# Patient Record
Sex: Female | Born: 1975 | Race: White | Hispanic: No | Marital: Married | Smoking: Never smoker
Health system: Southern US, Community
[De-identification: ages and names within clinical notes are randomized; demographics above are authoritative.]

## PROBLEM LIST (undated history)

## (undated) DIAGNOSIS — R7303 Prediabetes: Secondary | ICD-10-CM

## (undated) DIAGNOSIS — E669 Obesity, unspecified: Secondary | ICD-10-CM

## (undated) DIAGNOSIS — G4730 Sleep apnea, unspecified: Secondary | ICD-10-CM

## (undated) DIAGNOSIS — L723 Sebaceous cyst: Secondary | ICD-10-CM

## (undated) DIAGNOSIS — F341 Dysthymic disorder: Secondary | ICD-10-CM

## (undated) DIAGNOSIS — L0390 Cellulitis, unspecified: Secondary | ICD-10-CM

## (undated) DIAGNOSIS — E221 Hyperprolactinemia: Secondary | ICD-10-CM

## (undated) DIAGNOSIS — E785 Hyperlipidemia, unspecified: Secondary | ICD-10-CM

## (undated) DIAGNOSIS — E119 Type 2 diabetes mellitus without complications: Secondary | ICD-10-CM

## (undated) DIAGNOSIS — R0902 Hypoxemia: Secondary | ICD-10-CM

## (undated) DIAGNOSIS — F419 Anxiety disorder, unspecified: Secondary | ICD-10-CM

## (undated) DIAGNOSIS — R5383 Other fatigue: Secondary | ICD-10-CM

## (undated) HISTORY — DX: Obesity, unspecified: E66.9

## (undated) HISTORY — DX: Hypoxemia: R09.02

## (undated) HISTORY — DX: Type 2 diabetes mellitus without complications: E11.9

## (undated) HISTORY — DX: Sebaceous cyst: L72.3

## (undated) HISTORY — DX: Anxiety disorder, unspecified: F41.9

## (undated) HISTORY — DX: Hyperprolactinemia: E22.1

## (undated) HISTORY — DX: Hyperlipidemia, unspecified: E78.5

## (undated) HISTORY — DX: Prediabetes: R73.03

## (undated) HISTORY — DX: Cellulitis, unspecified: L03.90

## (undated) HISTORY — DX: Other fatigue: R53.83

## (undated) HISTORY — DX: Dysthymic disorder: F34.1

## (undated) HISTORY — DX: Sleep apnea, unspecified: G47.30

## (undated) HISTORY — PX: ABDOMINAL HYSTERECTOMY: SHX81

---

## 2015-10-14 ENCOUNTER — Ambulatory Visit (INDEPENDENT_AMBULATORY_CARE_PROVIDER_SITE_OTHER): Payer: Managed Care, Other (non HMO) | Admitting: Sports Medicine

## 2015-10-14 ENCOUNTER — Encounter: Payer: Self-pay | Admitting: Sports Medicine

## 2015-10-14 DIAGNOSIS — B351 Tinea unguium: Secondary | ICD-10-CM

## 2015-10-14 DIAGNOSIS — M79671 Pain in right foot: Secondary | ICD-10-CM

## 2015-10-14 DIAGNOSIS — M79672 Pain in left foot: Secondary | ICD-10-CM

## 2015-10-14 DIAGNOSIS — L853 Xerosis cutis: Secondary | ICD-10-CM | POA: Diagnosis not present

## 2015-10-14 DIAGNOSIS — M21619 Bunion of unspecified foot: Secondary | ICD-10-CM | POA: Diagnosis not present

## 2015-10-14 MED ORDER — TERBINAFINE HCL 250 MG PO TABS: 250.0000 mg | ORAL_TABLET | Freq: Every day | ORAL | Status: DC

## 2015-10-14 NOTE — Progress Notes (Deleted)
° °  Subjective:    Patient ID: Chelsea Freeman, female    DOB: 1976/04/19, 39 y.o.   MRN: [id number]  HPI    Review of Systems  Cardiovascular:       High or low BP   Psychiatric/Behavioral: The patient is nervous/anxious.   All other systems reviewed and are negative.      Objective:   Physical Exam        Assessment & Plan:

## 2015-10-14 NOTE — Patient Instructions (Signed)
Preventing Toenail Fungus from Recurring   Sanitize your shoes with Mycomist spray or a similar shoe sanitizer spray.  Follow the instructions on the bottle and dry them outside in the sun or with a hairdryer.  We also recommend repeating the sanitization once weekly in shoes you wear most often.   Throw away any shoes you have worn a significant amount without socks-fungus thrives in a warm moist environment and you want to avoid re-infection after your laser procedure   Bleach your socks with regular or color safe bleach   Change your socks regularly to keep your feet clean and dry (especially if you have sweaty feet)-if sweaty feet are a problem, let your doctor know-there is a great lotion that helps with this problem.   Clean your toenail clippers with alcohol before you use them if you do your own toenails and make sure to replace [employer] boards and orange sticks regularly   If you get regular pedicures, bring your own instruments or go to a spa that sterilizes their instruments in an autoclave.

## 2015-10-14 NOTE — Progress Notes (Signed)
Patient ID: Chelsea Freeman, female   DOB: Nov 29, 1975, 39 y.o.   MRN: [id number] Subjective: Chelsea Freeman is a 39 y.o. female patient seen today in office with complaint of painful thickened big toe nails concerning for fungus reports that she has tried multiple over the counter products with no improvement in her nails; desires treatment. Patient denies history of Diabetes, Neuropathy, or Vascular disease. Patient has no other pedal complaints at this time.   Review of Systems  Cardiovascular:       High or low BP   Psychiatric/Behavioral: The patient is nervous/anxious.   All other systems reviewed and are negative.  There are no active problems to display for this patient.  No current outpatient prescriptions on file prior to visit.   No current facility-administered medications on file prior to visit.   No Known Allergies   Objective: Physical Exam  General: Well developed, nourished, no acute distress, awake, alert and oriented x 3  Vascular: Dorsalis pedis artery 2/4 bilateral, Posterior tibial artery 2/4 bilateral, skin temperature warm to warm proximal to distal bilateral lower extremities, no varicosities, pedal hair present bilateral.  Neurological: Gross sensation present via light touch bilateral.   Dermatological: Skin is warm, dry, and supple bilateral, Bilateral hallux nails are, long, thick, and discolored with moderate subungal debris with mild lifting of left>right hallux nail, all other nails are short and mildly dystrophic, no webspace macerations present bilateral, no open lesions present bilateral, dry skin present bilateral heels with no signs of infection bilateral.  Musculoskeletal: Asymptomatic bunion deformities noted bilateral. Muscular strength within normal limits without pain or limitation on range of motion. No pain with calf compression bilateral.  Assessment and Plan:  Problem List Items Addressed This Visit    None    Visit Diagnoses     Dermatophytosis of nail    -  Primary    Dry skin        Foot pain, bilateral        Bunion          -Examined patient.  -Discussed treatment options for painful fungal nails. -Patient declined debridement and opt for oral antifungal treatment. -Recent lab work was obtained and reviewed from [facility]; labs are within normal limits -Rx Lamisil with instruction to take 1 pill a day and to monitor for adverse reactions; if experienced to call office and d/c medication. -Recommend good hygiene habits and tips given to prevent recurrence of nail fungus  -Recommended skin emollients for bilateral heels to use daily and good supportive shoes for foot type daily. -Patient to return in 6 weeks for follow up evaluation/medication management(LFTs) or sooner if symptoms worsen.  Landis Martins, DPM

## 2015-11-25 ENCOUNTER — Other Ambulatory Visit: Payer: Self-pay | Admitting: Sports Medicine

## 2015-11-25 ENCOUNTER — Encounter: Payer: Self-pay | Admitting: Sports Medicine

## 2015-11-25 ENCOUNTER — Ambulatory Visit (INDEPENDENT_AMBULATORY_CARE_PROVIDER_SITE_OTHER): Payer: Managed Care, Other (non HMO) | Admitting: Sports Medicine

## 2015-11-25 DIAGNOSIS — L853 Xerosis cutis: Secondary | ICD-10-CM | POA: Diagnosis not present

## 2015-11-25 DIAGNOSIS — B351 Tinea unguium: Secondary | ICD-10-CM | POA: Diagnosis not present

## 2015-11-25 DIAGNOSIS — M21619 Bunion of unspecified foot: Secondary | ICD-10-CM

## 2015-11-25 DIAGNOSIS — M79671 Pain in right foot: Secondary | ICD-10-CM

## 2015-11-25 DIAGNOSIS — M79672 Pain in left foot: Secondary | ICD-10-CM | POA: Diagnosis not present

## 2015-11-25 DIAGNOSIS — Z79899 Other long term (current) drug therapy: Secondary | ICD-10-CM | POA: Diagnosis not present

## 2015-11-25 LAB — CBC WITH DIFFERENTIAL/PLATELET
BASOS ABS: 0 10*3/uL (ref 0.0–0.2)
Basos: 0 %
EOS (ABSOLUTE): 0.1 10*3/uL (ref 0.0–0.4)
Eos: 2 %
Hematocrit: 42.8 % (ref 34.0–46.6)
Hemoglobin: 14.5 g/dL (ref 11.1–15.9)
LYMPHS ABS: 1.4 10*3/uL (ref 0.7–3.1)
Lymphs: 20 %
MCH: 29.8 pg (ref 26.6–33.0)
MCHC: 33.9 g/dL (ref 31.5–35.7)
MCV: 88 fL (ref 79–97)
MONOCYTES: 9 %
MONOS ABS: 0.6 10*3/uL (ref 0.1–0.9)
NEUTROS ABS: 4.5 10*3/uL (ref 1.4–7.0)
Neutrophils: 69 %
PLATELETS: 263 10*3/uL (ref 150–379)
RBC: 4.86 x10E6/uL (ref 3.77–5.28)
RDW: 13.5 % (ref 12.3–15.4)
WBC: 6.6 10*3/uL (ref 3.4–10.8)

## 2015-11-25 LAB — COMPREHENSIVE METABOLIC PANEL
A/G RATIO: 1.9 (ref 1.1–2.5)
ALT: 21 IU/L (ref 0–32)
AST: 12 IU/L (ref 0–40)
Albumin: 4.5 g/dL (ref 3.5–5.5)
Alkaline Phosphatase: 60 IU/L (ref 39–117)
BILIRUBIN TOTAL: 0.3 mg/dL (ref 0.0–1.2)
BUN/Creatinine Ratio: 21 — ABNORMAL HIGH (ref 8–20)
BUN: 13 mg/dL (ref 6–20)
CALCIUM: 9.7 mg/dL (ref 8.7–10.2)
CHLORIDE: 103 mmol/L (ref 96–106)
CO2: 27 mmol/L (ref 18–29)
Creatinine, Ser: 0.62 mg/dL (ref 0.57–1.00)
GFR calc Af Amer: 131 mL/min/{1.73_m2} (ref >59)
GFR calc non Af Amer: 114 mL/min/{1.73_m2} (ref >59)
GLUCOSE: 102 mg/dL — AB (ref 65–99)
Globulin, Total: 2.4 g/dL (ref 1.5–4.5)
POTASSIUM: 4.4 mmol/L (ref 3.5–5.2)
Sodium: 140 mmol/L (ref 134–144)
TOTAL PROTEIN: 6.9 g/dL (ref 6.0–8.5)

## 2015-11-25 LAB — HEPATIC FUNCTION PANEL: Bilirubin, Direct: <0.1 mg/dL (ref 0.00–0.40)

## 2015-11-25 NOTE — Progress Notes (Signed)
Patient ID: Chelsea Freeman, female   DOB: July 19, 1976, 40 y.o.   MRN: [id number]  Subjective: Chelsea Freeman is a 40 y.o. female patient seen today in office for follow up eval of  painful thickened fungal big toe nails. Patient is on Lamisil with no problems. Reports that Okeffe cream is helping dry heels. Patient has no other pedal complaints at this time.   There are no active problems to display for this patient.  Current Outpatient Prescriptions on File Prior to Visit  Medication Sig Dispense Refill   ALPRAZolam (XANAX) 0.25 MG tablet Take 0.25 mg by mouth daily as needed.  0   citalopram (CELEXA) 20 MG tablet Take 30 mg by mouth daily.  5   citalopram (CELEXA) 40 MG tablet Take 40 mg by mouth daily.  1   hydrochlorothiazide (MICROZIDE) 12.5 MG capsule Take 12.5 mg by mouth daily.  1   JUNEL 1.5/30 1.5-30 MG-MCG tablet TAKE 1 TABLET BY MOUTH ONCE (1) DAILY  3   terbinafine (LAMISIL) 250 MG tablet Take 1 tablet (250 mg total) by mouth daily. 30 tablet 3   No current facility-administered medications on file prior to visit.   No Known Allergies   Objective: Physical Exam  General: Well developed, nourished, no acute distress, awake, alert and oriented x 3  Vascular: Dorsalis pedis artery 2/4 bilateral, Posterior tibial artery 2/4 bilateral, skin temperature warm to warm proximal to distal bilateral lower extremities, no varicosities, pedal hair present bilateral.  Neurological: Gross sensation present via light touch bilateral.   Dermatological: Skin is warm, dry, and supple bilateral, Bilateral hallux nails are, mildly elongated, thick, and discolored with moderate subungal debris with mild lifting of left>right hallux nail, all other nails are short and mildly dystrophic, no webspace macerations present bilateral, no open lesions present bilateral, dry skin present bilateral heels with no signs of infection bilateral.  Musculoskeletal: Asymptomatic bunion deformities noted  bilateral. Muscular strength within normal limits without pain or limitation on range of motion. No pain with calf compression bilateral.  Assessment and Plan:  Problem List Items Addressed This Visit    None    Visit Diagnoses    Long-term use of high-risk medication    -  Primary    Relevant Orders    CBC with Differential    Basic Metabolic Panel    Hepatic Function Panel    Dermatophytosis of nail        Relevant Orders    CBC with Differential    Basic Metabolic Panel    Hepatic Function Panel    Dry skin        Foot pain, bilateral        Bunion          -Examined patient.  -Discussed treatment options for painful fungal nails. -Cont with oral Lamisl and to monitor for adverse reactions; if experienced to call office and d/c medication. -New bloodwork was ordered to be completed by patient for check of LFTs while on Lamisil; will call patient if labs are abnormal to DC medication. If the labs are normal, Patient can continue medication and will not receive a phone call.  -Recommend good hygiene habits and tips given to prevent recurrence of nail fungus  -Recommended cont skin emollients for bilateral heels to use daily and good supportive shoes for foot type daily. -Patient to return in 6 weeks for follow up evaluation/medication management(LFTs) or sooner if symptoms worsen.  Chelsea Freeman, DPM

## 2016-01-05 ENCOUNTER — Other Ambulatory Visit: Payer: Self-pay | Admitting: Sports Medicine

## 2016-01-06 ENCOUNTER — Ambulatory Visit: Payer: Managed Care, Other (non HMO) | Admitting: Sports Medicine

## 2016-06-28 DIAGNOSIS — N946 Dysmenorrhea, unspecified: Secondary | ICD-10-CM | POA: Insufficient documentation

## 2016-06-28 DIAGNOSIS — D251 Intramural leiomyoma of uterus: Secondary | ICD-10-CM

## 2016-06-28 DIAGNOSIS — N939 Abnormal uterine and vaginal bleeding, unspecified: Secondary | ICD-10-CM

## 2016-06-28 DIAGNOSIS — R350 Frequency of micturition: Secondary | ICD-10-CM

## 2016-06-28 DIAGNOSIS — N3941 Urge incontinence: Secondary | ICD-10-CM

## 2016-06-28 HISTORY — DX: Abnormal uterine and vaginal bleeding, unspecified: N93.9

## 2016-06-28 HISTORY — DX: Urge incontinence: N39.41

## 2016-06-28 HISTORY — DX: Dysmenorrhea, unspecified: N94.6

## 2016-06-28 HISTORY — DX: Frequency of micturition: R35.0

## 2016-06-28 HISTORY — DX: Intramural leiomyoma of uterus: D25.1

## 2019-09-03 HISTORY — PX: ABDOMINAL HYSTERECTOMY: SHX81

## 2020-07-03 ENCOUNTER — Encounter: Payer: Self-pay | Admitting: *Deleted

## 2020-07-06 ENCOUNTER — Encounter: Payer: Self-pay | Admitting: *Deleted

## 2020-07-06 ENCOUNTER — Ambulatory Visit (INDEPENDENT_AMBULATORY_CARE_PROVIDER_SITE_OTHER): Payer: Commercial Managed Care - PPO

## 2020-07-06 ENCOUNTER — Encounter: Payer: Self-pay | Admitting: Cardiology

## 2020-07-06 ENCOUNTER — Ambulatory Visit (INDEPENDENT_AMBULATORY_CARE_PROVIDER_SITE_OTHER): Payer: Commercial Managed Care - PPO | Admitting: Cardiology

## 2020-07-06 ENCOUNTER — Other Ambulatory Visit: Payer: Self-pay

## 2020-07-06 VITALS — BP 124/88 | HR 85 | Ht 63.0 in | Wt 224.8 lb

## 2020-07-06 DIAGNOSIS — R079 Chest pain, unspecified: Secondary | ICD-10-CM

## 2020-07-06 DIAGNOSIS — R002 Palpitations: Secondary | ICD-10-CM | POA: Diagnosis not present

## 2020-07-06 DIAGNOSIS — E669 Obesity, unspecified: Secondary | ICD-10-CM | POA: Diagnosis not present

## 2020-07-06 DIAGNOSIS — G4730 Sleep apnea, unspecified: Secondary | ICD-10-CM | POA: Insufficient documentation

## 2020-07-06 DIAGNOSIS — R5383 Other fatigue: Secondary | ICD-10-CM | POA: Insufficient documentation

## 2020-07-06 DIAGNOSIS — Z8249 Family history of ischemic heart disease and other diseases of the circulatory system: Secondary | ICD-10-CM

## 2020-07-06 DIAGNOSIS — G4733 Obstructive sleep apnea (adult) (pediatric): Secondary | ICD-10-CM

## 2020-07-06 DIAGNOSIS — R9431 Abnormal electrocardiogram [ECG] [EKG]: Secondary | ICD-10-CM

## 2020-07-06 DIAGNOSIS — Z9989 Dependence on other enabling machines and devices: Secondary | ICD-10-CM | POA: Insufficient documentation

## 2020-07-06 DIAGNOSIS — Z794 Long term (current) use of insulin: Secondary | ICD-10-CM

## 2020-07-06 DIAGNOSIS — R072 Precordial pain: Secondary | ICD-10-CM | POA: Diagnosis not present

## 2020-07-06 DIAGNOSIS — E119 Type 2 diabetes mellitus without complications: Secondary | ICD-10-CM | POA: Insufficient documentation

## 2020-07-06 DIAGNOSIS — E118 Type 2 diabetes mellitus with unspecified complications: Secondary | ICD-10-CM | POA: Insufficient documentation

## 2020-07-06 LAB — TROPONIN T: Troponin T (Highly Sensitive): <6 ng/L (ref 0–14)

## 2020-07-06 MED ORDER — NITROGLYCERIN 0.4 MG SL SUBL: 0.4000 mg | SUBLINGUAL_TABLET | SUBLINGUAL | 3 refills | Status: DC | PRN

## 2020-07-06 MED ORDER — METOPROLOL TARTRATE 100 MG PO TABS: 100.0000 mg | ORAL_TABLET | Freq: Once | ORAL | 0 refills | Status: DC

## 2020-07-06 NOTE — Addendum Note (Signed)
Addended by: Claude Manges on: 07/06/2020 01:11 PM   Modules accepted: Orders

## 2020-07-06 NOTE — Addendum Note (Signed)
Addended by: Truddie Hidden on: 07/06/2020 10:00 AM   Modules accepted: Orders

## 2020-07-06 NOTE — Addendum Note (Signed)
Addended by: Claude Manges on: 07/06/2020 10:06 AM   Modules accepted: Orders

## 2020-07-06 NOTE — Progress Notes (Signed)
Cardiology Office Note:    Date:  07/06/2020   ID:  Chelsea Freeman, DOB 1976/08/05, MRN [id number]  PCP:  Haydee Monica, [facility]  Cardiologist:  Berniece Salines, DO  Electrophysiologist:  None   Referring MD: Charlynn Court, NP   " I have been having chest pressure"  History of Present Illness:    Chelsea Freeman is a 44 y.o. female with a hx of diabetes mellitus, hyperlipidemia, obesity, family history of premature coronary artery disease in her father, sleep apnea she is CPAP.  The patient comes today after being referred by her primary care doctor to be evaluated for chest pressure and fatigue.  He tells me that this issue has been going on for about a couple months now.  She notes that she has been experiencing left-sided chest pressure.  She tells me radiates up her arm down her shoulders.  At times she does have numbness.  This can last to up to an hour.  She denies any associated shortness of breath.  Prior to a couple months ago she has not had any issue like this.  What she tells me in addition is the fact that she is significantly fatigued.  She sleeps at night and it feels as if she has not been having any sleep.  She does not have any interest to do any activities.  And sometimes when she does she has significant palpitations.  She denies any lightheadedness or dizziness.  Past Medical History:  Diagnosis Date  . Abnormal uterine bleeding (AUB) 06/28/2016  . Anxiety disorder   . Cellulitis   . Dysmenorrhea 06/28/2016  . Dysthymic disorder   . Fatigue   . Hyperlipidemia   . Hyperprolactinemia ([place])   . Hypoxemia   . Increased frequency of urination 06/28/2016  . Intramural leiomyoma of uterus 06/28/2016  . Obesity   . Prediabetes   . Sebaceous cyst   . Sleep apnea   . Type 2 diabetes mellitus without complications ([place])   . Urge incontinence 06/28/2016    Past Surgical History:  Procedure Laterality Date  . ABDOMINAL HYSTERECTOMY      Current Medications: Current  Meds  Medication Sig  . citalopram (CELEXA) 40 MG tablet Take 40 mg by mouth daily.  . metFORMIN (GLUCOPHAGE-XR) 500 MG 24 hr tablet Take 1,000 mg by mouth daily with breakfast.  . OZEMPIC, 0.25 OR 0.5 MG/DOSE, 2 MG/1.5ML SOPN Inject 0.5 mLs into the skin once a week.  . rosuvastatin (CRESTOR) 10 MG tablet Take 10 mg by mouth daily.     Allergies:   Patient has no known allergies.   Social History   Socioeconomic History  . Marital status: Married    Spouse name: Not on file  . Number of children: Not on file  . Years of education: Not on file  . Highest education level: Not on file  Occupational History  . Not on file  Tobacco Use  . Smoking status: Never Smoker  . Smokeless tobacco: Never Used  Substance and Sexual Activity  . Alcohol use: Not on file  . Drug use: Not on file  . Sexual activity: Not on file  Other Topics Concern  . Not on file  Social History Narrative  . Not on file   Social Determinants of Health   Financial Resource Strain:   . Difficulty of Paying Living Expenses: Not on file  Food Insecurity:   . Worried About Charity fundraiser in the Last Year: Not on  file  . [place] in the Last Year: Not on file  Transportation Needs:   . Lack of Transportation (Medical): Not on file  . Lack of Transportation (Non-Medical): Not on file  Physical Activity:   . Days of Exercise per Week: Not on file  . Minutes of Exercise per Session: Not on file  Stress:   . Feeling of Stress : Not on file  Social Connections:   . Frequency of Communication with Friends and Family: Not on file  . Frequency of Social Gatherings with Friends and Family: Not on file  . Attends Religious Services: Not on file  . Active Member of Clubs or Organizations: Not on file  . Attends Archivist Meetings: Not on file  . Marital Status: Not on file     Family History: The patient's family history includes Heart Problems in her father, paternal grandfather,  paternal grandmother, and sister.  ROS:   Review of Systems  Constitution: Reports fatigue.  Negative for decreased appetite, fever and weight gain.  HENT: Negative for congestion, ear discharge, hoarse voice and sore throat.   Eyes: Negative for discharge, redness, vision loss in right eye and visual halos.  Cardiovascular: Reports chest pain.  Negative for dyspnea on exertion, leg swelling, orthopnea and palpitations.  Respiratory: Negative for cough, hemoptysis, shortness of breath and snoring.   Endocrine: Negative for heat intolerance and polyphagia.  Hematologic/Lymphatic: Negative for bleeding problem. Does not bruise/bleed easily.  Skin: Negative for flushing, nail changes, rash and suspicious lesions.  Musculoskeletal: Negative for arthritis, joint pain, muscle cramps, myalgias, neck pain and stiffness.  Gastrointestinal: Negative for abdominal pain, bowel incontinence, diarrhea and excessive appetite.  Genitourinary: Negative for decreased libido, genital sores and incomplete emptying.  Neurological: Negative for brief paralysis, focal weakness, headaches and loss of balance.  Psychiatric/Behavioral: Negative for altered mental status, depression and suicidal ideas.  Allergic/Immunologic: Negative for HIV exposure and persistent infections.    EKGs/Labs/Other Studies Reviewed:    The following studies were reviewed today:   EKG:  The ekg ordered today demonstrates sinus rhythm, heart rate 85 bpm poor R wave progression in her precordial leads cannot rule out anteroseptal infarction.  No prior EKG for comparison.  Recent Labs: Blood work done by her PCP which shows glucose 136, creatinine 0.72, sodium 138, potassium 4.4, chloride 101, bicarb 22, total calcium 9.5, total protein 7.5, albumin 4.5, total globulin 3.0.  Recent Lipid Panel No results found for: CHOL, TRIG, HDL, CHOLHDL, VLDL, LDLCALC, LDLDIRECT  Physical Exam:    VS:  BP 124/88   Pulse 85   Ht $R'5\' 3"'Hx$  (1.6 m)    Wt 224 lb 12.8 oz (102 kg)   SpO2 97%   BMI 39.82 kg/m     Wt Readings from Last 3 Encounters:  07/06/20 224 lb 12.8 oz (102 kg)     GEN: Obese female, well nourished, well developed in no acute distress HEENT: Normal NECK: No JVD; No carotid bruits LYMPHATICS: No lymphadenopathy CARDIAC: S1S2 noted,RRR, no murmurs, rubs, gallops RESPIRATORY:  Clear to auscultation without rales, wheezing or rhonchi  ABDOMEN: Soft, non-tender, non-distended, +bowel sounds, no guarding. EXTREMITIES: No edema, No cyanosis, no clubbing MUSCULOSKELETAL:  No deformity  SKIN: Warm and dry NEUROLOGIC:  Alert and oriented x 3, non-focal PSYCHIATRIC:  Normal affect, good insight  ASSESSMENT:    1. Precordial pain   2. Type 2 diabetes mellitus with complication, with long-term current use of insulin (HCC)   3. Obesity (  BMI 30-39.9)   4. Family history of premature CAD   5. OSA on CPAP   6. Fatigue, unspecified type   7. Chest pain, unspecified type   8. Abnormal EKG    PLAN:    Patient tells me that she had some chest pain this morning.  I am going to get a high-sensitivity troponin today and if this is above normal limits I will send her to the hospital in the meantime I talked to her about a coronary CTA which will be the most appropriate testing for this patient.  She has no IV contrast dye allergy.  All of her questions has been answered.  He is agreeable to proceed with this testing.  Sublingual nitroglycerin prescription was sent, its protocol and 911 protocol explained and the patient vocalized understanding questions were answered to the patient's satisfaction.  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  Plan as noted above coronary CTA.  She has intermittent palpitations therefore I would like to rule out a cardiovascular etiology of this palpitation, therefore at this time I would like to placed a zio patch for   14  days. In additon a transthoracic  echocardiogram will be ordered to assess LV/RV function and any structural abnormalities. Once these testing have been performed amd reviewed further reccomendations will be made. For now, I do reccomend that the patient goes to the nearest ED if  symptoms recur.  Diabetes mellitus per primary care doctor.  Obstructive sleep apnea she has been on CPAP for 2.5 years.  If her testing within normal limits we may also consider retesting the patient with a sleep study to make sure that she has ultimate titration for CPAP.  She tells me that she recently had a vitamin D level by her PCP this was low and she has been replaced.  The patient understands the need to lose weight with diet and exercise. We have discussed specific strategies for this.  The patient is in agreement with the above plan. The patient left the office in stable condition.  The patient will follow up in 3 months or sooner if needed.  Medication Adjustments/Labs and Tests Ordered: Current medicines are reviewed at length with the patient today.  Concerns regarding medicines are outlined above.  Orders Placed This Encounter  Procedures  . CT CORONARY MORPH W/CTA COR W/SCORE W/CA W/CM &/OR WO/CM  . CT CORONARY FRACTIONAL FLOW RESERVE DATA PREP  . CT CORONARY FRACTIONAL FLOW RESERVE FLUID ANALYSIS  . EKG 12-Lead  . ECHOCARDIOGRAM COMPLETE   Meds ordered this encounter  Medications  . nitroGLYCERIN (NITROSTAT) 0.4 MG SL tablet    Sig: Place 1 tablet (0.4 mg total) under the tongue every 5 (five) minutes as needed for chest pain.    Dispense:  90 tablet    Refill:  3  . metoprolol tartrate (LOPRESSOR) 100 MG tablet    Sig: Take 1 tablet (100 mg total) by mouth once for 1 dose. Two hours prior to procedure    Dispense:  1 tablet    Refill:  0    Patient Instructions  Medication Instructions:    START TAKING NITROGLYCERIN 04 MG SUBLINGUAL FOR CHEST PAIN AS NEEDED    *If you need a refill on your cardiac medications before  your next appointment, please call your pharmacy*   Lab Work: TROPONIN STAT TODAY   If you have labs (blood work) drawn today and your tests are completely normal, you will receive your  results only by: Marland Kitchen MyChart Message (if you have MyChart) OR . A paper copy in the mail If you have any lab test that is abnormal or we need to change your treatment, we will call you to review the results.   Testing/Procedures: Your physician has requested that you have an echocardiogram. Echocardiography is a painless test that uses sound waves to create images of your heart. It provides your doctor with information about the size and shape of your heart and how well your heart's chambers and valves are working. This procedure takes approximately one hour. There are no restrictions for this procedure.  (FOR  14 DAYS ONLY AND MAIL RETURNED MONITOR BACK )Your physician has recommended that you wear an event monitor. Event monitors are medical devices that record the heart's electrical activity. Doctors most often [place] these monitors to diagnose arrhythmias. Arrhythmias are problems with the speed or rhythm of the heartbeat. The monitor is a small, portable device. You can wear one while you do your normal daily activities. This is usually used to diagnose what is causing palpitations/syncope (passing out).   Non-Cardiac CT Angiography (CTA), is a special type of CT scan that uses a computer to produce multi-dimensional views of major blood vessels throughout the body. In CT angiography, a contrast material is injected through an IV to help visualize the blood vessels   Follow-Up: At [facility], you and your health needs are our priority.  As part of our continuing mission to provide you with exceptional heart care, we have created designated Provider Care Teams.  These Care Teams include your primary Cardiologist (physician) and Advanced Practice Providers (APPs -  Physician Assistants and Nurse Practitioners)  who all work together to provide you with the care you need, when you need it.  We recommend signing up for the patient portal called "MyChart".  Sign up information is provided on this After Visit Summary.  MyChart is used to connect with patients for Virtual Visits (Telemedicine).  Patients are able to view lab/test results, encounter notes, upcoming appointments, etc.  Non-urgent messages can be sent to your provider as well.   To learn more about what you can do with MyChart, go to [email or web address].    Your next appointment:   3 month(s)  The format for your next appointment:   In Person  Provider:   You will see Berniece Salines, DO.  Or, you can be scheduled with the following Advanced Practice Provider on your designated Care Team (at our [facility]):  Laurann Montana, FNP     Other Instructions  Your cardiac CT will be scheduled at one of the below locations:   [facility] [address] [place], [place] [address] [phone]  [place] [address] [place], [place] [address] [phone]  If scheduled at [facility], please arrive at the [facility] main entrance of [facility] 30 minutes prior to test start time. Proceed to the [facility] Radiology Department (first floor) to check-in and test prep.  If scheduled at [facility], please arrive 15 mins early for check-in and test prep.  Please follow these instructions carefully (unless otherwise directed):   On the Night Before the Test: . Be sure to Drink plenty of water. . Do not consume any caffeinated/decaffeinated beverages or chocolate 12 hours prior to your test. . Do not take any antihistamines 12 hours prior to your test.   On  the Day of the Test: . Drink plenty of water. Do not drink any water within one hour of the test. . Do not eat any food 4 hours prior to the test. . You may  take your regular medications prior to the test.  . Take metoprolol (Lopressor) two hours prior to test. . HOLD Furosemide/Hydrochlorothiazide morning of the test. . FEMALES- please wear underwire-free bra if available   *For Clinical Staff only. Please instruct patient the following:*        -Drink plenty of water       -Hold Furosemide/hydrochlorothiazide morning of the test       -Take metoprolol (Lopressor) 2 hours prior to test (if applicable).    Do not give Lopressor to patients with an allergy to lopressor or anyone with asthma or active COPD symptoms (currently taking steroids).       After the Test: . Drink plenty of water. . After receiving IV contrast, you may experience a mild flushed feeling. This is normal. . On occasion, you may experience a mild rash up to 24 hours after the test. This is not dangerous. If this occurs, you can take Benadryl 25 mg and increase your fluid intake. . If you experience trouble breathing, this can be serious. If it is severe call 911 IMMEDIATELY. If it is mild, please call our office. . If you take any of these medications: Glipizide/Metformin, Avandament, Glucavance, please do not take 48 hours after completing test unless otherwise instructed.   Once we have confirmed authorization from your insurance company, we will call you to set up a date and time for your test. Based on how quickly your insurance processes prior authorizations requests, please allow up to 4 weeks to be contacted for scheduling your Cardiac CT appointment. Be advised that routine Cardiac CT appointments could be scheduled as many as 8 weeks after your provider has ordered it.  For non-scheduling related questions, please contact the cardiac imaging nurse navigator should you have any questions/concerns: Marchia Bond, Cardiac Imaging Nurse Navigator Burley Saver, Interim Cardiac Imaging Nurse [place] and Vascular Services Direct Office Dial: [phone]    For scheduling needs, including cancellations and rescheduling, please call Vivien Rota at [phone], option 3.       Adopting a Healthy Lifestyle.  Know what a healthy weight is for you (roughly BMI <25) and aim to maintain this   Aim for 7+ servings of fruits and vegetables daily   65-80+ fluid ounces of water or unsweet tea for healthy kidneys   Limit to max 1 drink of alcohol per day; avoid smoking/tobacco   Limit animal fats in diet for cholesterol and heart health - choose grass fed whenever available   Avoid highly processed foods, and foods high in saturated/trans fats   Aim for low stress - take time to unwind and care for your mental health   Aim for 150 min of moderate intensity exercise weekly for heart health, and weights twice weekly for bone health   Aim for 7-9 hours of sleep daily   When it comes to diets, agreement about the perfect plan isnt easy to find, even among the experts. Experts at the [employer] developed an idea known as the Healthy Eating Plate. Just imagine a plate divided into logical, healthy portions.   The emphasis is on diet quality:   Load up on vegetables and fruits - one-half of your plate: Aim for color and variety, and remember that  potatoes dont count.   Go for whole grains - one-quarter of your plate: Whole wheat, barley, wheat berries, quinoa, oats, brown rice, and foods made with them. If you want pasta, go with whole wheat pasta.   Protein power - one-quarter of your plate: Fish, chicken, beans, and nuts are all healthy, versatile protein sources. Limit red meat.   The diet, however, does go beyond the plate, offering a few other suggestions.   Use healthy plant oils, such as olive, canola, soy, corn, sunflower and peanut. Check the labels, and avoid partially hydrogenated oil, which have unhealthy trans fats.   If youre thirsty, drink water. Coffee and tea are good in moderation, but skip sugary drinks and  limit milk and dairy products to one or two daily servings.   The type of carbohydrate in the diet is more important than the amount. Some sources of carbohydrates, such as vegetables, fruits, whole grains, and beans-are healthier than others.   Finally, stay active  Signed, Berniece Salines, DO  07/06/2020 9:50 AM    [facility]

## 2020-07-06 NOTE — Patient Instructions (Addendum)
Medication Instructions:    START TAKING NITROGLYCERIN 04 MG SUBLINGUAL FOR CHEST PAIN AS NEEDED    *If you need a refill on your cardiac medications before your next appointment, please call your pharmacy*   Lab Work: TROPONIN STAT TODAY   RETURN 4-5 DAYS BEFORE CT SCAN FOR BMET   If you have labs (blood work) drawn today and your tests are completely normal, you will receive your results only by: Marland Kitchen MyChart Message (if you have MyChart) OR . A paper copy in the mail If you have any lab test that is abnormal or we need to change your treatment, we will call you to review the results.   Testing/Procedures: Your physician has requested that you have an echocardiogram. Echocardiography is a painless test that uses sound waves to create images of your heart. It provides your doctor with information about the size and shape of your heart and how well your heart's chambers and valves are working. This procedure takes approximately one hour. There are no restrictions for this procedure.  (FOR  14 DAYS ONLY AND MAIL RETURNED MONITOR BACK )Your physician has recommended that you wear an event monitor. Event monitors are medical devices that record the heart's electrical activity. Doctors most often [place] these monitors to diagnose arrhythmias. Arrhythmias are problems with the speed or rhythm of the heartbeat. The monitor is a small, portable device. You can wear one while you do your normal daily activities. This is usually used to diagnose what is causing palpitations/syncope (passing out).   Non-Cardiac CT Angiography (CTA), is a special type of CT scan that uses a computer to produce multi-dimensional views of major blood vessels throughout the body. In CT angiography, a contrast material is injected through an IV to help visualize the blood vessels   Follow-Up: At [facility], you and your health needs are our priority.  As part of our continuing mission to provide you with exceptional heart  care, we have created designated Provider Care Teams.  These Care Teams include your primary Cardiologist (physician) and Advanced Practice Providers (APPs -  Physician Assistants and Nurse Practitioners) who all work together to provide you with the care you need, when you need it.  We recommend signing up for the patient portal called "MyChart".  Sign up information is provided on this After Visit Summary.  MyChart is used to connect with patients for Virtual Visits (Telemedicine).  Patients are able to view lab/test results, encounter notes, upcoming appointments, etc.  Non-urgent messages can be sent to your provider as well.   To learn more about what you can do with MyChart, go to [email or web address].    Your next appointment:   3 month(s)  The format for your next appointment:   In Person  Provider:   You will see Berniece Salines, DO.  Or, you can be scheduled with the following Advanced Practice Provider on your designated Care Team (at our [facility]):  Laurann Montana, FNP     Other Instructions  Your cardiac CT will be scheduled at one of the below locations:   [facility] [address] [place], [place] [address] [phone]  [place] [address] [place], [place] [address] [phone]  If scheduled at [facility], please arrive at the [facility] main entrance of [facility] 30 minutes prior to test start time. Proceed to the [facility] Radiology Department (first floor) to check-in and test prep.  If scheduled at [facility], please arrive 15 mins early for check-in and test prep.  Please follow these instructions carefully (unless otherwise directed):   On the Night Before the Test: . Be sure to Drink plenty of water. . Do not consume any caffeinated/decaffeinated beverages or chocolate 12 hours prior to your test. . Do not take any  antihistamines 12 hours prior to your test.   On the Day of the Test: . Drink plenty of water. Do not drink any water within one hour of the test. . Do not eat any food 4 hours prior to the test. . You may take your regular medications prior to the test.  . Take metoprolol (Lopressor) two hours prior to test. . HOLD Furosemide/Hydrochlorothiazide morning of the test. . FEMALES- please wear underwire-free bra if available   *For Clinical Staff only. Please instruct patient the following:*        -Drink plenty of water       -Hold Furosemide/hydrochlorothiazide morning of the test       -Take metoprolol (Lopressor) 2 hours prior to test (if applicable).    Do not give Lopressor to patients with an allergy to lopressor or anyone with asthma or active COPD symptoms (currently taking steroids).       After the Test: . Drink plenty of water. . After receiving IV contrast, you may experience a mild flushed feeling. This is normal. . On occasion, you may experience a mild rash up to 24 hours after the test. This is not dangerous. If this occurs, you can take Benadryl 25 mg and increase your fluid intake. . If you experience trouble breathing, this can be serious. If it is severe call 911 IMMEDIATELY. If it is mild, please call our office. . If you take any of these medications: Glipizide/Metformin, Avandament, Glucavance, please do not take 48 hours after completing test unless otherwise instructed.   Once we have confirmed authorization from your insurance company, we will call you to set up a date and time for your test. Based on how quickly your insurance processes prior authorizations requests, please allow up to 4 weeks to be contacted for scheduling your Cardiac CT appointment. Be advised that routine Cardiac CT appointments could be scheduled as many as 8 weeks after your provider has ordered it.  For non-scheduling related questions, please contact the cardiac imaging nurse navigator  should you have any questions/concerns: Marchia Bond, Cardiac Imaging Nurse Navigator Burley Saver, Interim Cardiac Imaging Nurse [place] and Vascular Services Direct Office Dial: [phone]   For scheduling needs, including cancellations and rescheduling, please call Vivien Rota at [phone], option 3.

## 2020-07-14 ENCOUNTER — Other Ambulatory Visit: Payer: Self-pay

## 2020-07-14 ENCOUNTER — Ambulatory Visit (INDEPENDENT_AMBULATORY_CARE_PROVIDER_SITE_OTHER): Payer: Commercial Managed Care - PPO

## 2020-07-14 DIAGNOSIS — R079 Chest pain, unspecified: Secondary | ICD-10-CM | POA: Diagnosis not present

## 2020-07-14 DIAGNOSIS — R5383 Other fatigue: Secondary | ICD-10-CM

## 2020-07-14 LAB — ECHOCARDIOGRAM COMPLETE
Area-P 1/2: 4.49 cm2
S' Lateral: 3.6 cm

## 2020-07-14 NOTE — Progress Notes (Signed)
Complete echocardiogram performed.  Jimmy Kendarrius Tanzi RDCS, RVT  

## 2020-07-28 LAB — BASIC METABOLIC PANEL
BUN/Creatinine Ratio: 13 (ref 9–23)
BUN: 8 mg/dL (ref 6–24)
CO2: 21 mmol/L (ref 20–29)
Calcium: 9.8 mg/dL (ref 8.7–10.2)
Chloride: 102 mmol/L (ref 96–106)
Creatinine, Ser: 0.63 mg/dL (ref 0.57–1.00)
GFR calc Af Amer: 127 mL/min/{1.73_m2} (ref >59)
GFR calc non Af Amer: 110 mL/min/{1.73_m2} (ref >59)
Glucose: 97 mg/dL (ref 65–99)
Potassium: 4.2 mmol/L (ref 3.5–5.2)
Sodium: 139 mmol/L (ref 134–144)

## 2020-07-29 ENCOUNTER — Telehealth (HOSPITAL_COMMUNITY): Payer: Self-pay | Admitting: Emergency Medicine

## 2020-07-29 NOTE — Telephone Encounter (Signed)
Reaching out to patient to offer assistance regarding upcoming cardiac imaging study; pt verbalizes understanding of appt date/time, parking situation and where to check in, pre-test NPO status and medications ordered, and verified current allergies; name and call back number provided for further questions should they arise Deboraha Goar RN Navigator Cardiac Imaging [facility] [phone] office [phone] cell 

## 2020-07-31 ENCOUNTER — Ambulatory Visit (HOSPITAL_COMMUNITY)
Admission: RE | Admit: 2020-07-31 | Discharge: 2020-07-31 | Disposition: A | Discharge status: Home / Self Care | Payer: Commercial Managed Care - PPO | Source: Ambulatory Visit | Attending: Cardiology | Admitting: Cardiology

## 2020-07-31 DIAGNOSIS — R079 Chest pain, unspecified: Secondary | ICD-10-CM | POA: Insufficient documentation

## 2020-07-31 MED ORDER — NITROGLYCERIN 0.4 MG SL SUBL
0.8000 mg | SUBLINGUAL_TABLET | Freq: Once | SUBLINGUAL | Status: AC
  Administered 2020-07-31: 0.8 mg via SUBLINGUAL

## 2020-07-31 MED ORDER — IOHEXOL 350 MG/ML SOLN
80.0000 mL | Freq: Once | INTRAVENOUS | Status: AC | PRN
  Administered 2020-07-31: 80 mL via INTRAVENOUS

## 2020-07-31 MED ORDER — DILTIAZEM HCL 25 MG/5ML IV SOLN
5.0000 mg | Freq: Once | INTRAVENOUS | Status: AC
  Administered 2020-07-31: 5 mg via INTRAVENOUS
  Filled 2020-07-31: qty 5

## 2020-07-31 MED ORDER — METOPROLOL TARTRATE 5 MG/5ML IV SOLN
5.0000 mg | INTRAVENOUS | Status: DC | PRN
  Administered 2020-07-31 (×3): 5 mg via INTRAVENOUS

## 2020-07-31 MED ORDER — NITROGLYCERIN 0.4 MG SL SUBL
SUBLINGUAL_TABLET | SUBLINGUAL | Status: AC
  Filled 2020-07-31: qty 2

## 2020-07-31 MED ORDER — METOPROLOL TARTRATE 5 MG/5ML IV SOLN
INTRAVENOUS | Status: AC
  Filled 2020-07-31: qty 15

## 2020-07-31 MED ORDER — DILTIAZEM HCL 25 MG/5ML IV SOLN
INTRAVENOUS | Status: AC
  Filled 2020-07-31: qty 5

## 2020-07-31 NOTE — Progress Notes (Signed)
CT scan completed. Tolerated well. D/C home ambulatory awake and alert. In no distress °

## 2020-08-03 ENCOUNTER — Telehealth: Payer: Self-pay

## 2020-08-03 NOTE — Telephone Encounter (Signed)
Spoke with patient regarding results and recommendation.  Patient verbalizes understanding and is agreeable to plan of care. Advised patient to call back with any issues or concerns.  

## 2020-08-03 NOTE — Telephone Encounter (Signed)
-----   Message from Berniece Salines, DO sent at 08/03/2020  2:00 PM EDT ----- Cardiac CT is normal.  Calcium score is 0.  The cardiac portion of your CT scan is normal.  The noncardiac portion of your CT scan showed multiple liver lesions I am going to forward this to your primary doctor because there is recommendation that you should get an MRI to be able to define the lesions appropriately.  Please forward the patient testing to her PCP and/or speak with the PCPs nurse to transfer the information of her abnormal liver finding.

## 2020-08-05 ENCOUNTER — Other Ambulatory Visit: Payer: Self-pay | Admitting: Nurse Practitioner

## 2020-08-05 DIAGNOSIS — K769 Liver disease, unspecified: Secondary | ICD-10-CM

## 2020-08-08 ENCOUNTER — Other Ambulatory Visit: Payer: Self-pay

## 2020-08-08 ENCOUNTER — Ambulatory Visit
Admission: RE | Admit: 2020-08-08 | Discharge: 2020-08-08 | Disposition: A | Discharge status: Home / Self Care | Payer: Commercial Managed Care - PPO | Source: Ambulatory Visit | Attending: Nurse Practitioner | Admitting: Nurse Practitioner

## 2020-08-08 DIAGNOSIS — K769 Liver disease, unspecified: Secondary | ICD-10-CM

## 2020-08-08 MED ORDER — GADOBENATE DIMEGLUMINE 529 MG/ML IV SOLN
20.0000 mL | Freq: Once | INTRAVENOUS | Status: AC | PRN
  Administered 2020-08-08: 20 mL via INTRAVENOUS

## 2020-08-26 ENCOUNTER — Other Ambulatory Visit: Payer: Commercial Managed Care - PPO

## 2020-10-05 DIAGNOSIS — R7303 Prediabetes: Secondary | ICD-10-CM | POA: Insufficient documentation

## 2020-10-05 DIAGNOSIS — E221 Hyperprolactinemia: Secondary | ICD-10-CM | POA: Insufficient documentation

## 2020-10-05 DIAGNOSIS — L0390 Cellulitis, unspecified: Secondary | ICD-10-CM | POA: Insufficient documentation

## 2020-10-05 DIAGNOSIS — F341 Dysthymic disorder: Secondary | ICD-10-CM | POA: Insufficient documentation

## 2020-10-05 DIAGNOSIS — L723 Sebaceous cyst: Secondary | ICD-10-CM | POA: Insufficient documentation

## 2020-10-05 DIAGNOSIS — E785 Hyperlipidemia, unspecified: Secondary | ICD-10-CM | POA: Insufficient documentation

## 2020-10-05 DIAGNOSIS — E669 Obesity, unspecified: Secondary | ICD-10-CM | POA: Insufficient documentation

## 2020-10-05 DIAGNOSIS — F419 Anxiety disorder, unspecified: Secondary | ICD-10-CM | POA: Insufficient documentation

## 2020-10-05 DIAGNOSIS — R0902 Hypoxemia: Secondary | ICD-10-CM | POA: Insufficient documentation

## 2020-10-05 DIAGNOSIS — G4730 Sleep apnea, unspecified: Secondary | ICD-10-CM | POA: Insufficient documentation

## 2020-10-05 DIAGNOSIS — E119 Type 2 diabetes mellitus without complications: Secondary | ICD-10-CM | POA: Insufficient documentation

## 2020-10-07 ENCOUNTER — Ambulatory Visit: Payer: Commercial Managed Care - PPO | Admitting: Cardiology

## 2020-12-31 ENCOUNTER — Other Ambulatory Visit: Payer: Self-pay | Admitting: Cardiology

## 2020-12-31 DIAGNOSIS — R079 Chest pain, unspecified: Secondary | ICD-10-CM

## 2021-01-13 ENCOUNTER — Other Ambulatory Visit: Payer: Self-pay | Admitting: Nurse Practitioner

## 2021-01-13 DIAGNOSIS — Z1231 Encounter for screening mammogram for malignant neoplasm of breast: Secondary | ICD-10-CM

## 2021-02-01 ENCOUNTER — Ambulatory Visit
Admission: RE | Admit: 2021-02-01 | Discharge: 2021-02-01 | Disposition: A | Discharge status: Home / Self Care | Payer: Commercial Managed Care - PPO | Source: Ambulatory Visit | Attending: Nurse Practitioner | Admitting: Nurse Practitioner

## 2021-02-01 ENCOUNTER — Other Ambulatory Visit: Payer: Self-pay

## 2021-02-01 DIAGNOSIS — Z1231 Encounter for screening mammogram for malignant neoplasm of breast: Secondary | ICD-10-CM

## 2021-02-03 ENCOUNTER — Other Ambulatory Visit: Payer: Self-pay | Admitting: Nurse Practitioner

## 2021-02-03 DIAGNOSIS — R928 Other abnormal and inconclusive findings on diagnostic imaging of breast: Secondary | ICD-10-CM

## 2021-02-12 ENCOUNTER — Other Ambulatory Visit: Payer: Self-pay

## 2021-02-12 ENCOUNTER — Ambulatory Visit: Payer: Commercial Managed Care - PPO

## 2021-02-12 ENCOUNTER — Ambulatory Visit
Admission: RE | Admit: 2021-02-12 | Discharge: 2021-02-12 | Disposition: A | Discharge status: Home / Self Care | Payer: Commercial Managed Care - PPO | Source: Ambulatory Visit | Attending: Nurse Practitioner | Admitting: Nurse Practitioner

## 2021-02-12 DIAGNOSIS — R928 Other abnormal and inconclusive findings on diagnostic imaging of breast: Secondary | ICD-10-CM

## 2021-02-16 ENCOUNTER — Other Ambulatory Visit: Payer: Commercial Managed Care - PPO

## 2021-02-19 ENCOUNTER — Other Ambulatory Visit: Payer: Commercial Managed Care - PPO

## 2021-03-02 ENCOUNTER — Institutional Professional Consult (permissible substitution): Payer: Commercial Managed Care - PPO | Admitting: Neurology

## 2021-04-07 DIAGNOSIS — F330 Major depressive disorder, recurrent, mild: Secondary | ICD-10-CM | POA: Insufficient documentation

## 2021-04-07 DIAGNOSIS — F419 Anxiety disorder, unspecified: Secondary | ICD-10-CM | POA: Insufficient documentation

## 2021-04-07 DIAGNOSIS — E785 Hyperlipidemia, unspecified: Secondary | ICD-10-CM | POA: Insufficient documentation

## 2021-04-07 DIAGNOSIS — E119 Type 2 diabetes mellitus without complications: Secondary | ICD-10-CM | POA: Insufficient documentation

## 2021-04-07 DIAGNOSIS — G4730 Sleep apnea, unspecified: Secondary | ICD-10-CM | POA: Insufficient documentation

## 2021-04-07 DIAGNOSIS — E221 Hyperprolactinemia: Secondary | ICD-10-CM | POA: Insufficient documentation

## 2021-04-07 DIAGNOSIS — F341 Dysthymic disorder: Secondary | ICD-10-CM | POA: Insufficient documentation

## 2021-04-12 ENCOUNTER — Institutional Professional Consult (permissible substitution): Payer: Commercial Managed Care - PPO | Admitting: Neurology

## 2021-06-07 ENCOUNTER — Institutional Professional Consult (permissible substitution): Payer: Commercial Managed Care - PPO | Admitting: Neurology

## 2021-06-19 IMAGING — CT CT HEART MORP W/ CTA COR W/ SCORE W/ CA W/CM &/OR W/O CM
4 of 7 series · 8 of 20 positions shown, 9 images · IV contrast (APPLIED)
Comparison: None.
COMPARISON: None.

Addendum:
EXAM:
OVER-READ INTERPRETATION  CT CHEST

The following report is an over-read performed by radiologist Dr.
Dozza Yong [REDACTED] on 07/31/2020. This
over-read does not include interpretation of cardiac or coronary
anatomy or pathology. The coronary calcium score/coronary CTA
interpretation by the cardiologist is attached.
TECHNIQUE: The patient was scanned on a [employer] Force scanner.

[Series 6: best diast 74 % · axial · 0.39mm/px · z∈[+986,+1026]mm · 2 of 301 slices shown, 3 images]
[im 101/301  vessel]
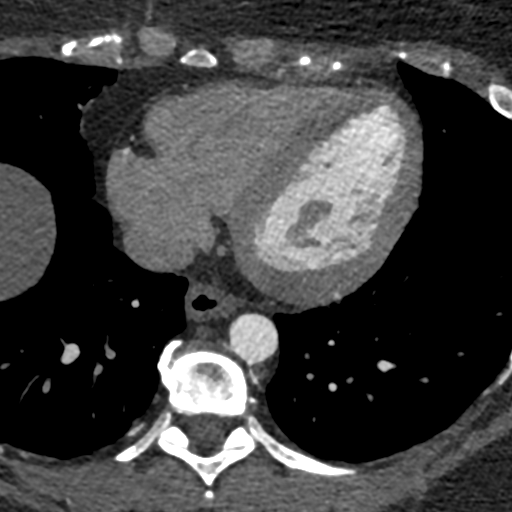
[im 101/301  lung]
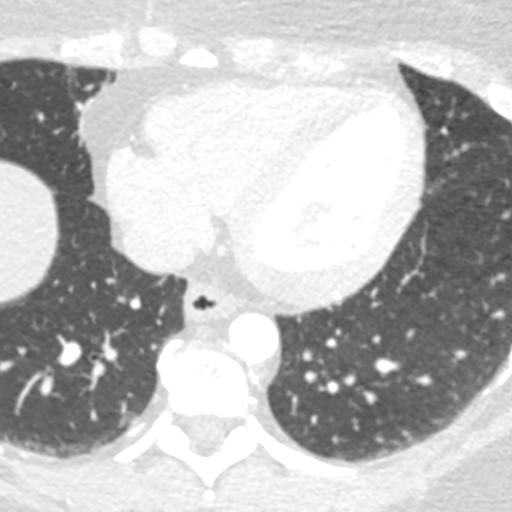
[im 201/301  vessel]
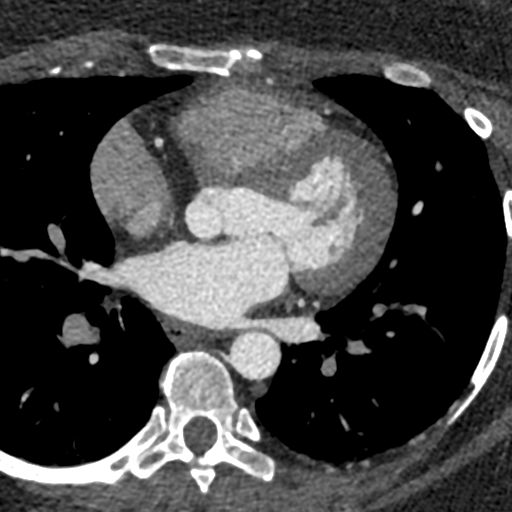

[Series 7: best syst 44 % · axial · 0.39mm/px · z∈[+986,+1026]mm · 2 of 301 slices shown]
[im 101/301  vessel]
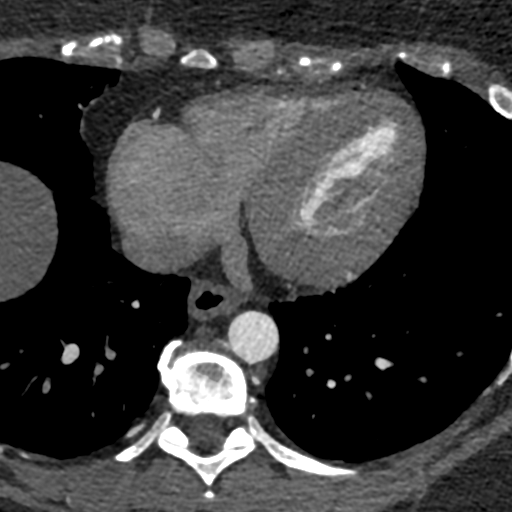
[im 201/301  vessel]
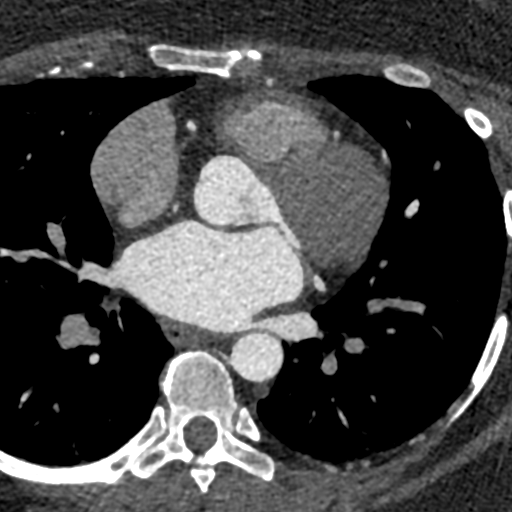

[Series 8: ts diast sharp 74 % · axial · 0.39mm/px · z∈[+986,+1026]mm · 2 of 301 slices shown]
[im 101/301  lung]
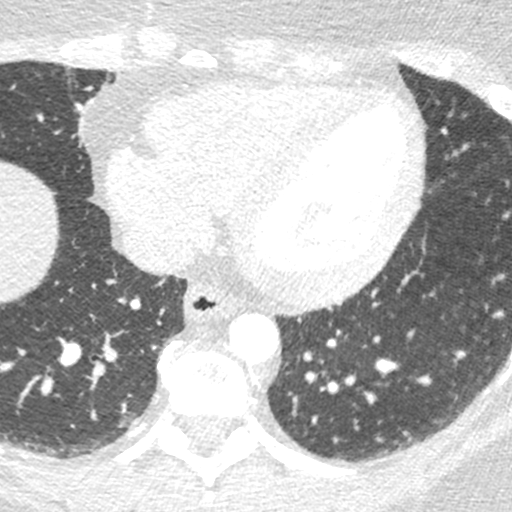
[im 201/301  lung]
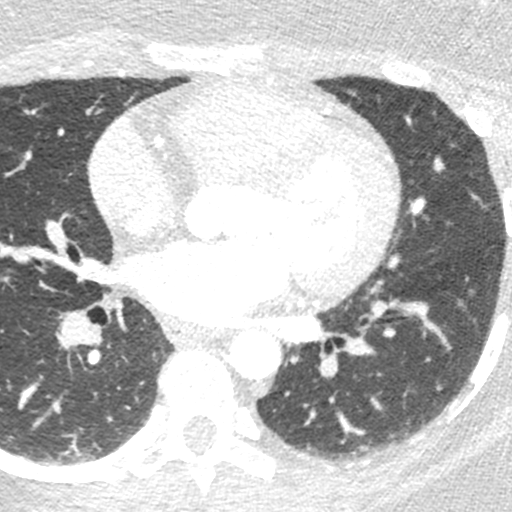

[Series 9: ts syst sharp 44 % · axial · 0.39mm/px · z∈[+986,+1026]mm · 2 of 301 slices shown]
[im 101/301  lung]
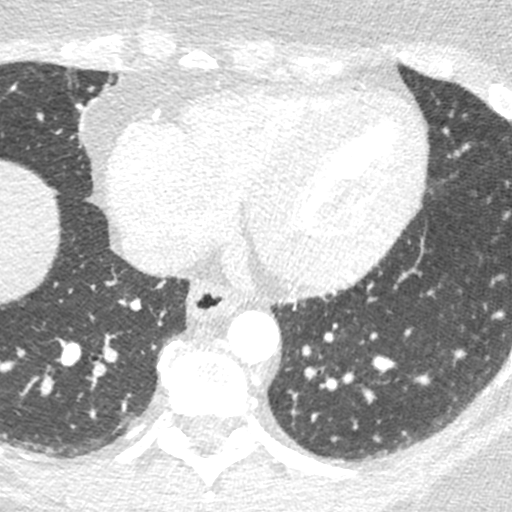
[im 201/301  lung]
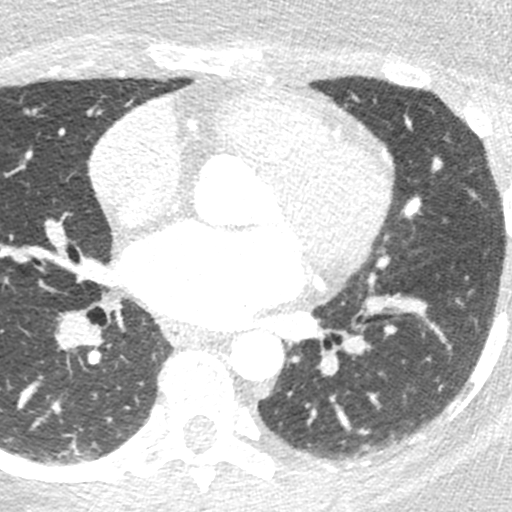

[8 of 20 positions shown; findings below may reference images not displayed]

FINDINGS: Within the visualized portions of the thorax there are no suspicious
appearing pulmonary nodules or masses, there is no acute
consolidative airspace disease, no pleural effusions, no
pneumothorax and no lymphadenopathy. Visualized portions of the
upper abdomen demonstrates low-attenuation lesions in the liver,
largest of which is in the superior aspect of the right lobe of the
liver predominantly in segment 8 measuring 3.9 x 3.2 cm, which
appears to have some internal septations. There is also a 1.3 x
cm hypervascular lesion between segments 2 and 4A (axial image 55 of
series 11). There are no aggressive appearing lytic or blastic
lesions noted in the visualized portions of the skeleton.
IMPRESSION: 1. Multiple liver lesions which are incompletely characterized on
today's examination. Further evaluation with nonemergent abdominal
MRI with and without IV gadolinium is recommended in the near future
to provide definitive characterization.

EXAM:
Cardiac/Coronary  CT
FINDINGS: A 120 kV prospective scan was triggered in the descending thoracic
aorta at 111 HU's. Axial non-contrast 3 mm slices were carried out
through the heart. The data set was analyzed on a dedicated work
station and scored using the Agatson method. Gantry rotation speed
was 250 msecs and collimation was .6 mm. No beta blockade and 0.8 mg
of sl NTG was given. The 3D data set was reconstructed in 5%
intervals of the 67-82 % of the R-R cycle. Diastolic phases were
analyzed on a dedicated work station using MPR, MIP and VRT modes.
The patient received 80 cc of contrast.

Aorta: Normal size. Ascending aorta 2.4 cm. No calcifications. No
dissection.

Aortic Valve:  Trileaflet.  No calcifications.

Coronary Arteries:  Normal coronary origin.  Right dominance.

RCA is a large dominant artery that gives rise to PDA and PLVB.
There is no plaque.

Left main is a large artery that gives rise to LAD and LCX arteries.

LAD is a large vessel that has no plaque. There is a large D1
without plaque.

LCX is a non-dominant artery that gives rise to three OM branches.
There is no plaque.

Other findings:

Normal pulmonary vein drainage into the left atrium.

Normal let atrial appendage without a thrombus.

Normal size of the pulmonary artery.
IMPRESSION: 1. Coronary calcium score of 0. This was 0 percentile for age and
sex matched control.

2. Normal coronary origin with right dominance.

3. No evidence of CAD.

*** End of Addendum ***
EXAM:
OVER-READ INTERPRETATION  CT CHEST

The following report is an over-read performed by radiologist Dr.
Dozza Yong [REDACTED] on 07/31/2020. This
over-read does not include interpretation of cardiac or coronary
anatomy or pathology. The coronary calcium score/coronary CTA
interpretation by the cardiologist is attached.
FINDINGS: Within the visualized portions of the thorax there are no suspicious
appearing pulmonary nodules or masses, there is no acute
consolidative airspace disease, no pleural effusions, no
pneumothorax and no lymphadenopathy. Visualized portions of the
upper abdomen demonstrates low-attenuation lesions in the liver,
largest of which is in the superior aspect of the right lobe of the
liver predominantly in segment 8 measuring 3.9 x 3.2 cm, which
appears to have some internal septations. There is also a 1.3 x
cm hypervascular lesion between segments 2 and 4A (axial image 55 of
series 11). There are no aggressive appearing lytic or blastic
lesions noted in the visualized portions of the skeleton.
IMPRESSION: 1. Multiple liver lesions which are incompletely characterized on
today's examination. Further evaluation with nonemergent abdominal
MRI with and without IV gadolinium is recommended in the near future
to provide definitive characterization.

## 2022-02-10 ENCOUNTER — Encounter: Payer: Self-pay | Admitting: Nurse Practitioner

## 2022-02-10 ENCOUNTER — Other Ambulatory Visit: Payer: Self-pay | Admitting: Nurse Practitioner

## 2022-02-10 ENCOUNTER — Other Ambulatory Visit: Payer: Self-pay

## 2022-02-10 ENCOUNTER — Ambulatory Visit: Payer: 59 | Admitting: Nurse Practitioner

## 2022-02-10 VITALS — BP 126/74 | HR 98 | Temp 97.2°F | Ht 62.0 in | Wt 229.0 lb

## 2022-02-10 DIAGNOSIS — R110 Nausea: Secondary | ICD-10-CM | POA: Diagnosis not present

## 2022-02-10 DIAGNOSIS — R5383 Other fatigue: Secondary | ICD-10-CM

## 2022-02-10 DIAGNOSIS — Z794 Long term (current) use of insulin: Secondary | ICD-10-CM

## 2022-02-10 DIAGNOSIS — E1165 Type 2 diabetes mellitus with hyperglycemia: Secondary | ICD-10-CM | POA: Diagnosis not present

## 2022-02-10 DIAGNOSIS — Z1231 Encounter for screening mammogram for malignant neoplasm of breast: Secondary | ICD-10-CM

## 2022-02-10 DIAGNOSIS — Z6841 Body Mass Index (BMI) 40.0 and over, adult: Secondary | ICD-10-CM

## 2022-02-10 DIAGNOSIS — F5081 Binge eating disorder: Secondary | ICD-10-CM

## 2022-02-10 DIAGNOSIS — F418 Other specified anxiety disorders: Secondary | ICD-10-CM

## 2022-02-10 DIAGNOSIS — F50819 Binge eating disorder, unspecified: Secondary | ICD-10-CM

## 2022-02-10 MED ORDER — VENLAFAXINE HCL ER 150 MG PO CP24: 150.0000 mg | ORAL_CAPSULE | Freq: Every day | ORAL | 1 refills | Status: DC

## 2022-02-10 MED ORDER — LISDEXAMFETAMINE DIMESYLATE 30 MG PO CAPS: 30.0000 mg | ORAL_CAPSULE | Freq: Every day | ORAL | 0 refills | Status: DC

## 2022-02-10 MED ORDER — METFORMIN HCL ER 500 MG PO TB24: 1000.0000 mg | ORAL_TABLET | Freq: Every day | ORAL | 1 refills | Status: DC

## 2022-02-10 MED ORDER — ONDANSETRON HCL 4 MG PO TABS: 4.0000 mg | ORAL_TABLET | Freq: Three times a day (TID) | ORAL | 0 refills | Status: DC | PRN

## 2022-02-10 MED ORDER — SIMVASTATIN 10 MG PO TABS
10.0000 mg | ORAL_TABLET | Freq: Every day | ORAL | 3 refills | Status: DC
Start: 2022-02-10 — End: 2022-03-10

## 2022-02-10 MED ORDER — OZEMPIC (1 MG/DOSE) 4 MG/3ML ~~LOC~~ SOPN: 1.0000 mg | PEN_INJECTOR | SUBCUTANEOUS | 0 refills | Status: DC

## 2022-02-10 NOTE — Patient Instructions (Addendum)
We will call you with lab results ?Resume Ozempic 0.5 mg injection weekly for 4 weeks, then increase   ?Take Zofran as needed for nausea ?Begin Vyvanse 30 mg daily, notify office immediately of any adverse side effects ?Follow-up in 4-weeks ? ?Binge-Eating Disorder ?Binge-eating disorder is a condition that involves repeated episodes of binge-eating. Binge-eating refers to eating a larger-than-normal amount of food in a short period of time, usually within 2 hours. People with this condition may eat even when they are not hungry, and they do not stop eating even when they feel full. People with binge-eating disorder feel unable to control their eating. Although they feel bad about overeating, they usually do not try to undo the bingeing by using laxatives or making themselves vomit. They do not starve themselves or exercise too much. ?Binge-eating disorder usually starts in the teenage years or early 78s. It often gets worse with stress. ?What are the causes? ?The cause of this condition is not known. However, it may be influenced by: ?Having a family history of eating disorders. ?Factors that are inherited from family (genetics). ?Experiencing trauma. ?Having low self-esteem or poor body image. ?Having other mental health issues. ?What increases the risk? ?The following factors may make you more likely to develop this condition: ?Being a teenager or in your early 60s. ?Being female. Binge-eating disorder can affect males, but it is more common in females. ?Being overweight or obese. ?Having a mental health disorder, such as depression or anxiety. ?Having a substance use disorder, such as alcohol use disorder. ?Having a history of unhealthy dieting, such as meal skipping, yo-yo dieting, food restricting, or avoiding certain kinds of foods. ?What are the signs or symptoms? ?Symptoms of this condition include: ?Eating much more quickly than normal. ?Eating to the point of feeling physically uncomfortable. ?Eating  large amounts of food when you are not hungry. ?Eating alone because you are embarrassed about how much you are eating. ?Feeling disgusted, depressed, or guilty after overeating. ?How is this diagnosed? ?This condition is diagnosed through an assessment by your health care provider. You may be diagnosed with the disorder if you: ?Binge-eat an average of one or more times a week for three months or longer. ?Have three or more of the symptoms of the disorder. ?Once you have been diagnosed, your level of binge-eating disorder will be rated from mild to severe. The rating is based on how often you binge-eat. ?How is this treated? ?This condition may be treated with: ?Cognitive behavioral therapy (CBT). This is a form of talk therapy that helps you recognize the thoughts, beliefs, and emotions that contribute to overeating. It also helps you change them. ?Interpersonal psychotherapy. This is a form of talk therapy that focuses on fixing relationship problems that trigger binge-eating episodes. ?Dialectical behavioral therapy (DBT). This is a form of talk therapy that helps you learn skills to control your emotions and tolerate distress without binge-eating. ?Medicine. ?Treatment is usually provided by mental health professionals, such as psychologists, psychiatrists, licensed professional counselors, and clinical social workers. ?Follow these instructions at home: ?Lifestyle ?  ?Work to develop a healthy relationship with food. Talk with your health care provider or a nutrition specialist (dietitian). He or she can provide guidance about healthy eating and healthy lifestyle choices. ?Eat a healthy diet that consists of lean meats and low-fat dairy products, as well as foods that are high in fiber, such as fresh fruits and vegetables, whole grains, and beans. ?Start an exercise routine and stay active. Aim for  30 minutes of exercise a day on 5 or more days a week to keep your body strong and healthy. You may need to  exercise more if you want to lose weight. Talk with your health care provider about how much and what type of exercise you can do. Some ways to be active include: ?Playing sports. ?Biking. ?Skating or skateboarding. ?Dancing. ?Running, walking, jogging, or hiking. ?Doing yard work. ?General instructions ?Take over-the-counter and prescription medicines only as told by your health care provider. ?Drink enough fluid to keep your urine pale yellow. ?Keep all follow-up visits. This is important. ?Where to find more information ?National Eating Disorders Association (NEDA): ?www.nationaleatingdisorders.org ?[place]: 1-989-832-7697 ?Contact a health care provider if: ?Your symptoms get worse. ?You start having new symptoms. ?You start compensating for eating binges with harmful behaviors, such as: ?Making yourself vomit. ?Exercising too much. ?Using laxatives. ?Get help right away if: ?You have serious thoughts about hurting yourself or someone else. ?If you ever feel like you may hurt yourself or others, or have thoughts about taking your own life, get help right away. You can go to your nearest emergency department or: ?Call your local emergency services (911 in the [place] ?Call a suicide crisis helpline, such as the [facility] at 712-587-9104 or 988 in the [place]. This is open 24 hours a day in the [place] ?Text the Crisis Text Line at 989-795-5712 (in the [place].). ?Summary ?You may have binge-eating disorder if you have feelings of guilt from overeating, eat to the point of feeling uncomfortable, eat a large amount of food in a short time, or find yourself eating when you are not hungry. Seek help from your health care provider. ?The exact cause of a binge-eating disorder is not known. There are some risk factors for this disease, such as having a mental health disorder and having a history of unhealthy dieting. ?There are a variety of treatment options such as counseling therapy and medicines.  These can help you overcome your binge-eating disorder. ?This information is not intended to replace advice given to you by your health care provider. Make sure you discuss any questions you have with your health care provider. ?Document Revised: 05/19/2021 Document Reviewed: 03/24/2021 ?Elsevier Patient Education ? 2022 [place]. ? ? ?Calorie Counting for Weight Loss ?Calories are units of energy. Your body needs a certain number of calories from food to keep going throughout the day. When you eat or drink more calories than your body needs, your body stores the extra calories mostly as fat. When you eat or drink fewer calories than your body needs, your body burns fat to get the energy it needs. ?Calorie counting means keeping track of how many calories you eat and drink each day. Calorie counting can be helpful if you need to lose weight. If you eat fewer calories than your body needs, you should lose weight. Ask your health care provider what a healthy weight is for you. ?For calorie counting to work, you will need to eat the right number of calories each day to lose a healthy amount of weight per week. A dietitian can help you figure out how many calories you need in a day and will suggest ways to reach your calorie goal. ?A healthy amount of weight to lose each week is usually 1-2 lb (0.5-0.9 kg). This usually means that your daily calorie intake should be reduced by 500-750 calories. ?Eating 1,200-1,500 calories a day can help most women lose weight. ?Eating 1,500-1,800  calories a day can help most men lose weight. ?What do I need to know about calorie counting? ?Work with your health care provider or dietitian to determine how many calories you should get each day. To meet your daily calorie goal, you will need to: ?Find out how many calories are in each food that you would like to eat. Try to do this before you eat. ?Decide how much of the food you plan to eat. ?Keep a food log. Do this by writing down  what you ate and how many calories it had. ?To successfully lose weight, it is important to balance calorie counting with a healthy lifestyle that includes regular activity. ?Where do I find calorie information?

## 2022-02-10 NOTE — Progress Notes (Signed)
? ?New Patient Office Visit ? ?Subjective:  ?Patient ID: Chelsea Freeman, female    DOB: 12/26/1975  Age: 46 y.o. MRN: [id number] ? ?CC:  ?Chief Complaint  ?Patient presents with  ? Establish Care  ? Depression  ? Anxiety  ? Diabetes  ? Hypothyroidism  ? Hyperlipidemia  ? ? ?HPI ?Chelsea Freeman presents to establish care. This is her initial visit to the office. She has type 2 DM, morbid obesity, depression with anxiety. She tells me she would like to lose weight to improve overall health. States she has tried Adipex in the past. Discontinue due to negative side effects. She was prescribed low dose of Semaglutide in the the past for type 2 DM, did not have weight loss.  ? ? ?GAD-7 Results ? ?  02/10/2022  ? 11:17 AM  ?GAD-7 Generalized Anxiety Disorder Screening Tool  ?1. Feeling Nervous, Anxious, or on Edge 0  ?2. Not Being Able to Stop or Control Worrying 0  ?3. Worrying Too Much About Different Things 0  ?4. Trouble Relaxing 0  ?5. Being So Restless it's Hard To Sit Still 0  ?6. Becoming Easily Annoyed or Irritable 0  ?7. Feeling Afraid As If Something Awful Might Happen 0  ?Total GAD-7 Score 0  ?Difficulty At Work, Home, or Getting  Along With Others? Not difficult at all  ? ? ? ? ? ?  02/10/2022  ? 11:17 AM  ?Depression screen PHQ 2/9  ?Decreased Interest 0  ?Down, Depressed, Hopeless 0  ?PHQ - 2 Score 0  ?Altered sleeping 0  ?Tired, decreased energy 3  ?Change in appetite 3  ?Feeling bad or failure about yourself  0  ?Trouble concentrating 0  ?Moving slowly or fidgety/restless 0  ?Suicidal thoughts 0  ?PHQ-9 Score 6  ?Difficult doing work/chores Not difficult at all  ?  ?       ? ?BEDS-7 ? ? ?During the last 3 months, did you have any episodes of excessive overeating (I.e., eating significantly more than most people would eat in a similar period of time? Yes ? ?NOTE: IF YOUR ANSWERED "NO" TO QUESTIONS 1, YOU MAY STOP. THE REMAINING QUESTIONS DO NOT APPLY. ? ?Do you feel distressed about your episodes of  excessive overeating? Yes ? ?Within the past 3 months... ? ?During your episodes of excessive overeating, how often did you feel like you had no control over your eating (e.g., not being able to stop eating, feel compelled to eat, or going back and forth for more food)? Sometimes ? ?During your episodes of excessive overeating, how often did you continue eating even though you were not hungry? Always ? ?During your episodes of excessive overeating, how often were you embarrassed by how much you ate? Always ? ?During your episodes of excessive overeating, how often did you feel disgusted with yourself or guilty afterward? Always ? ?During your episodes of excessive overeating, how often did you make yourself vomit as a means to control your weight or shape? Never or Rarely ? ? ?Pertinent Labs: ?No results found for: CHOL, HDL, LDLCALC, LDLDIRECT, TRIG, CHOLHDL Lab Results  ?Component Value Date  ? NA 139 07/27/2020  ? K 4.2 07/27/2020  ? CREATININE 0.63 07/27/2020  ? GFRNONAA 110 07/27/2020  ? GLUCOSE 97 07/27/2020  ?  ? ? ? ?Past Medical History:  ?Diagnosis Date  ? Abnormal uterine bleeding (AUB) 06/28/2016  ? Anxiety disorder   ? Cellulitis   ? Dysmenorrhea 06/28/2016  ? Dysthymic disorder   ?  Fatigue   ? Hyperlipidemia   ? Hyperprolactinemia ([place])   ? Hypoxemia   ? Increased frequency of urination 06/28/2016  ? 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 Obesity   ? Prediabetes   ? Sebaceous cyst   ? Sleep apnea   ? Type 2 diabetes mellitus without complications ([place])   ? Urge incontinence 06/28/2016  ? ? ?Past Surgical History:  ?Procedure Laterality Date  ? ABDOMINAL HYSTERECTOMY    ? ? ?Family History  ?Problem Relation Age of Onset  ? Hypertension Mother   ? Depression Mother   ? Atrial fibrillation Mother   ? Hypertension Father   ? Diabetes Father   ? Heart Problems Father   ? Parkinson's disease Father   ? Atrial fibrillation Sister   ? Thyroid disease Sister   ? Cancer Sister   ?     Breast-remission  ? Heart  Problems Sister   ? Breast cancer Sister   ? Bipolar disorder Sister   ? Kidney failure Sister   ? Cancer Maternal Grandfather   ?     Leukemia  ? Heart Problems Paternal Grandmother   ? Heart failure Paternal Grandmother   ? Stroke Paternal Grandfather   ? Heart Problems Paternal Grandfather   ? ? ?Social History  ? ?Socioeconomic History  ? Marital status: Married  ?  Spouse name: Nyima Vanacker  ? Number of children: 2  ? Years of education: Not on file  ? Highest education level: Not on file  ?Occupational History  ? Occupation: ink Production designer, theatre/television/film  ?Tobacco Use  ? Smoking status: Never  ? Smokeless tobacco: Never  ?Substance and Sexual Activity  ? Alcohol use: Not Currently  ? Drug use: Not Currently  ? Sexual activity: Yes  ?  Partners: Male  ?Other Topics Concern  ? Not on file  ?Social History Narrative  ? ** Merged History Encounter **  ?    ? ?Social Determinants of Health  ? ?Financial Resource Strain: Low Risk   ? Difficulty of Paying Living Expenses: Not hard at all  ?Food Insecurity: No Food Insecurity  ? Worried About Charity fundraiser in the Last Year: Never true  ? Ran Out of Food in the Last Year: Never true  ?Transportation Needs: No Transportation Needs  ? Lack of Transportation (Medical): No  ? Lack of Transportation (Non-Medical): No  ?Physical Activity: Not on file  ?Stress: No Stress Concern Present  ? Feeling of Stress : Not at all  ?Social Connections: Moderately Isolated  ? Frequency of Communication with Friends and Family: More than three times a week  ? Frequency of Social Gatherings with Friends and Family: More than three times a week  ? Attends Religious Services: Never  ? Active Member of Clubs or Organizations: No  ? Attends Archivist Meetings: Never  ? Marital Status: Married  ?Intimate Partner Violence: Not At Risk  ? Fear of Current or Ex-Partner: No  ? Emotionally Abused: No  ? Physically Abused: No  ? Sexually Abused: No  ? ? ?ROS ?Review of Systems  ?Constitutional:   Negative for chills, fatigue and fever.  ?HENT:  Negative for congestion, ear pain, rhinorrhea and sore throat.   ?Respiratory:  Negative for cough and shortness of breath.   ?Cardiovascular:  Negative for chest pain.  ?Gastrointestinal:  Negative for abdominal pain, constipation, diarrhea, nausea and vomiting.  ?Genitourinary:  Negative for dysuria and urgency.  ?Musculoskeletal:  Negative for back pain and  myalgias.  ?Neurological:  Negative for dizziness, weakness, light-headedness and headaches.  ?Psychiatric/Behavioral:  Negative for dysphoric mood. The patient is not nervous/anxious.   ? ?Objective:  ? ?Today's Vitals: BP 126/74   Pulse 98   Temp (!) 97.2 ?F (36.2 ?C)   Ht '5\' 2"'$  (1.575 m)   Wt 229 lb (103.9 kg)   LMP  (LMP Unknown)   SpO2 98%   BMI 41.88 kg/m?  ? ?Physical Exam ?Vitals reviewed.  ?Constitutional:   ?   Appearance: She is obese.  ?HENT:  ?   Head: Normocephalic.  ?   Right Ear: Tympanic membrane normal.  ?   Left Ear: Tympanic membrane normal.  ?   Nose: Nose normal.  ?   Mouth/Throat:  ?   Mouth: Mucous membranes are moist.  ?Eyes:  ?   Pupils: Pupils are equal, round, and reactive to light.  ?Cardiovascular:  ?   Rate and Rhythm: Normal rate and regular rhythm.  ?   Pulses: Normal pulses.  ?   Heart sounds: Normal heart sounds.  ?Pulmonary:  ?   Effort: Pulmonary effort is normal.  ?   Breath sounds: Normal breath sounds.  ?Abdominal:  ?   General: Bowel sounds are normal.  ?   Palpations: Abdomen is soft.  ?Musculoskeletal:     ?   General: Normal range of motion.  ?   Cervical back: Neck supple.  ?Skin: ?   General: Skin is warm and dry.  ?   Capillary Refill: Capillary refill takes less than 2 seconds.  ?Neurological:  ?   General: No focal deficit present.  ?   Mental Status: She is alert and oriented to person, place, and time.  ?Psychiatric:     ?   Mood and Affect: Mood normal.     ?   Behavior: Behavior normal.  ? ? ?Assessment & Plan:  ? ?1. Type 2 diabetes mellitus with  hyperglycemia, without long-term current use of insulin (HCC) ?- CBC with Differential/Platelet ?- Comprehensive metabolic panel ?- Hemoglobin A1c ?- Lipid panel ?- TSH ?- Microalbumin / creatinine urine ratio ?- simvast

## 2022-02-11 MED ORDER — OZEMPIC (0.25 OR 0.5 MG/DOSE) 2 MG/1.5ML ~~LOC~~ SOPN: 0.5000 mg | PEN_INJECTOR | SUBCUTANEOUS | 0 refills | Status: DC

## 2022-02-14 LAB — VITAMIN D 25 HYDROXY (VIT D DEFICIENCY, FRACTURES): Vit D, 25-Hydroxy: 25.2 ng/mL — ABNORMAL LOW (ref 30.0–100.0)

## 2022-02-14 LAB — CBC WITH DIFFERENTIAL/PLATELET
Basophils Absolute: 0.1 10*3/uL (ref 0.0–0.2)
Basos: 1 %
EOS (ABSOLUTE): 0.2 10*3/uL (ref 0.0–0.4)
Eos: 3 %
Hematocrit: 41.7 % (ref 34.0–46.6)
Hemoglobin: 14.5 g/dL (ref 11.1–15.9)
Immature Grans (Abs): 0 10*3/uL (ref 0.0–0.1)
Immature Granulocytes: 1 %
Lymphocytes Absolute: 1.7 10*3/uL (ref 0.7–3.1)
Lymphs: 27 %
MCH: 30.7 pg (ref 26.6–33.0)
MCHC: 34.8 g/dL (ref 31.5–35.7)
MCV: 88 fL (ref 79–97)
Monocytes Absolute: 0.5 10*3/uL (ref 0.1–0.9)
Monocytes: 9 %
Neutrophils Absolute: 3.9 10*3/uL (ref 1.4–7.0)
Neutrophils: 59 %
Platelets: 253 10*3/uL (ref 150–450)
RBC: 4.72 x10E6/uL (ref 3.77–5.28)
RDW: 12.4 % (ref 11.7–15.4)
WBC: 6.4 10*3/uL (ref 3.4–10.8)

## 2022-02-14 LAB — COMPREHENSIVE METABOLIC PANEL
ALT: 119 IU/L — ABNORMAL HIGH (ref 0–32)
AST: 66 IU/L — ABNORMAL HIGH (ref 0–40)
Albumin/Globulin Ratio: 1.6 (ref 1.2–2.2)
Albumin: 4.9 g/dL — ABNORMAL HIGH (ref 3.8–4.8)
Alkaline Phosphatase: 86 IU/L (ref 44–121)
BUN/Creatinine Ratio: 11 (ref 9–23)
BUN: 7 mg/dL (ref 6–24)
Bilirubin Total: 0.4 mg/dL (ref 0.0–1.2)
CO2: 24 mmol/L (ref 20–29)
Calcium: 10.1 mg/dL (ref 8.7–10.2)
Chloride: 101 mmol/L (ref 96–106)
Creatinine, Ser: 0.66 mg/dL (ref 0.57–1.00)
Globulin, Total: 3 g/dL (ref 1.5–4.5)
Glucose: 135 mg/dL — ABNORMAL HIGH (ref 70–99)
Potassium: 4.9 mmol/L (ref 3.5–5.2)
Sodium: 142 mmol/L (ref 134–144)
Total Protein: 7.9 g/dL (ref 6.0–8.5)
eGFR: 110 mL/min/{1.73_m2} (ref >59)

## 2022-02-14 LAB — LIPID PANEL
Chol/HDL Ratio: 5.3 ratio — ABNORMAL HIGH (ref 0.0–4.4)
Cholesterol, Total: 292 mg/dL — ABNORMAL HIGH (ref 100–199)
HDL: 55 mg/dL (ref >39)
LDL Chol Calc (NIH): 209 mg/dL — ABNORMAL HIGH (ref 0–99)
Triglycerides: 152 mg/dL — ABNORMAL HIGH (ref 0–149)
VLDL Cholesterol Cal: 28 mg/dL (ref 5–40)

## 2022-02-14 LAB — TSH: TSH: 0.856 u[IU]/mL (ref 0.450–4.500)

## 2022-02-14 LAB — MICROALBUMIN / CREATININE URINE RATIO
Creatinine, Urine: 141.7 mg/dL
Microalb/Creat Ratio: 10 mg/g creat (ref 0–29)
Microalbumin, Urine: 13.8 ug/mL

## 2022-02-14 LAB — HEMOGLOBIN A1C
Est. average glucose Bld gHb Est-mCnc: 163 mg/dL
Hgb A1c MFr Bld: 7.3 % — ABNORMAL HIGH (ref 4.8–5.6)

## 2022-02-14 LAB — CARDIOVASCULAR RISK ASSESSMENT

## 2022-02-15 ENCOUNTER — Other Ambulatory Visit: Payer: Self-pay

## 2022-02-15 MED ORDER — VITAMIN D (ERGOCALCIFEROL) 1.25 MG (50000 UNIT) PO CAPS: 50000.0000 [IU] | ORAL_CAPSULE | ORAL | 3 refills | Status: DC

## 2022-03-09 NOTE — Progress Notes (Signed)
? ?Subjective:  ?Patient ID: Chelsea Freeman, female    DOB: 1976/10/08  Age: 46 y.o. MRN: [id number] ? ?Chief Complaint  ?Patient presents with  ? Binge eating  ? Diabetes  ? ? ?HPI ?Chelsea Freeman presents today for follow-up of binge-eating and depression medication management. She was prescribed Vyvanse 30 mg daily. She has she has experienced tachycardia and no reduction in binge eating behavior. She is not currently in counseling. States heart rate has ranges from 100s-160s per smart watch. Denies chest pain or dyspnea. States she has fatigue.  ? ?Chelsea Freeman is currently prescribed Effexor 150 mg daily for depression. Reports side effects of excessive sweating. States she would like to try a different medication to treat symptoms. She is concerned with side effects of weight gain. She has previously been prescribed Zoloft, Citalopram, and Paxil.  ? ? ?Current Outpatient Medications on File Prior to Visit  ?Medication Sig Dispense Refill  ? liothyronine (CYTOMEL) 5 MCG tablet Take 5 mcg by mouth daily.    ? lisdexamfetamine (VYVANSE) 30 MG capsule Take 1 capsule (30 mg total) by mouth daily. 30 capsule 0  ? metFORMIN (GLUCOPHAGE-XR) 500 MG 24 hr tablet Take 2 tablets (1,000 mg total) by mouth daily with breakfast. 180 tablet 1  ? ondansetron (ZOFRAN) 4 MG tablet Take 1 tablet (4 mg total) by mouth every 8 (eight) hours as needed for nausea or vomiting. 20 tablet 0  ? Semaglutide, 1 MG/DOSE, (OZEMPIC, 1 MG/DOSE,) 4 MG/3ML SOPN Inject 1 mg into the skin once a week. 3 mL 0  ? Semaglutide,0.25 or 0.'5MG'$ /DOS, (OZEMPIC, 0.25 OR 0.5 MG/DOSE,) 2 MG/1.5ML SOPN Inject 0.5 mg into the skin once a week. 3 mL 0  ? simvastatin (ZOCOR) 10 MG tablet Take 1 tablet (10 mg total) by mouth at bedtime. 90 tablet 3  ? venlafaxine XR (EFFEXOR-XR) 150 MG 24 hr capsule Take 1 capsule (150 mg total) by mouth daily. 90 capsule 1  ? Vitamin D, Ergocalciferol, (DRISDOL) 1.25 MG (50000 UNIT) CAPS capsule Take 1 capsule (50,000 Units total) by mouth  every 7 (seven) days. 5 capsule 3  ? ?No current facility-administered medications on file prior to visit.  ? ?Past Medical History:  ?Diagnosis Date  ? Abnormal uterine bleeding (AUB) 06/28/2016  ? Anxiety disorder   ? Cellulitis   ? Dysmenorrhea 06/28/2016  ? Dysthymic disorder   ? Fatigue   ? Hyperlipidemia   ? Hyperprolactinemia ([place])   ? Hypoxemia   ? Increased frequency of urination 06/28/2016  ? 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 Obesity   ? Prediabetes   ? Sebaceous cyst   ? Sleep apnea   ? Type 2 diabetes mellitus without complications ([place])   ? Urge incontinence 06/28/2016  ? ?Past Surgical History:  ?Procedure Laterality Date  ? ABDOMINAL HYSTERECTOMY    ?  ?Family History  ?Problem Relation Age of Onset  ? Hypertension Mother   ? Depression Mother   ? Atrial fibrillation Mother   ? Hypertension Father   ? Diabetes Father   ? Heart Problems Father   ? Parkinson's disease Father   ? Atrial fibrillation Sister   ? Thyroid disease Sister   ? Cancer Sister   ?     Breast-remission  ? Heart Problems Sister   ? Breast cancer Sister   ? Bipolar disorder Sister   ? Kidney failure Sister   ? Cancer Maternal Grandfather   ?     Leukemia  ? Heart Problems Paternal Grandmother   ?  Heart failure Paternal Grandmother   ? Stroke Paternal Grandfather   ? Heart Problems Paternal Grandfather   ? ?Social History  ? ?Socioeconomic History  ? Marital status: Married  ?  Spouse name: Keirsten Matuska  ? Number of children: 2  ? Years of education: Not on file  ? Highest education level: Not on file  ?Occupational History  ? Occupation: ink Production designer, theatre/television/film  ?Tobacco Use  ? Smoking status: Never  ? Smokeless tobacco: Never  ?Substance and Sexual Activity  ? Alcohol use: Not Currently  ? Drug use: Not Currently  ? Sexual activity: Yes  ?  Partners: Male  ?Other Topics Concern  ? Not on file  ?Social History Narrative  ? ** Merged History Encounter **  ?    ? ?Social Determinants of Health  ? ?Financial Resource Strain: Low Risk   ?  Difficulty of Paying Living Expenses: Not hard at all  ?Food Insecurity: No Food Insecurity  ? Worried About Charity fundraiser in the Last Year: Never true  ? Ran Out of Food in the Last Year: Never true  ?Transportation Needs: No Transportation Needs  ? Lack of Transportation (Medical): No  ? Lack of Transportation (Non-Medical): No  ?Physical Activity: Not on file  ?Stress: No Stress Concern Present  ? Feeling of Stress : Not at all  ?Social Connections: Moderately Isolated  ? Frequency of Communication with Friends and Family: More than three times a week  ? Frequency of Social Gatherings with Friends and Family: More than three times a week  ? Attends Religious Services: Never  ? Active Member of Clubs or Organizations: No  ? Attends Archivist Meetings: Never  ? Marital Status: Married  ? ? ?Review of Systems  ?Constitutional:  Negative for chills, fatigue and fever.  ?HENT:  Negative for congestion, ear pain, rhinorrhea and sore throat.   ?Respiratory:  Negative for cough and shortness of breath.   ?Cardiovascular:  Negative for chest pain.  ?Gastrointestinal:  Negative for abdominal pain, constipation, diarrhea, nausea and vomiting.  ?Genitourinary:  Negative for dysuria and urgency.  ?Musculoskeletal:  Negative for back pain and myalgias.  ?Neurological:  Negative for dizziness, weakness, light-headedness and headaches.  ?Psychiatric/Behavioral:  Negative for dysphoric mood. The patient is not nervous/anxious.   ? ? ?Objective:  ?LMP  (LMP Unknown)  ? ?BP 136/80   Pulse (!) 106   Temp (!) 95.5 ?F (35.3 ?C)   Ht '5\' 2"'$  (1.575 m)   Wt 227 lb (103 kg)   LMP  (LMP Unknown)   SpO2 98%   BMI 41.52 kg/m?   ? ? ?  02/10/2022  ? 11:03 AM 07/31/2020  ?  1:08 PM 07/31/2020  ? 12:43 PM  ?BP/Weight  ?Systolic BP 378 588 502  ?Diastolic BP 74 59 63  ?Wt. (Lbs) 229    ?BMI 41.88 kg/m2    ? ? ?Physical Exam ?Vitals reviewed.  ?Constitutional:   ?   Appearance: She is obese.  ?Cardiovascular:  ?   Rate and  Rhythm: Regular rhythm. Tachycardia present.  ?Pulmonary:  ?   Effort: Pulmonary effort is normal.  ?   Breath sounds: Normal breath sounds.  ?Skin: ?   General: Skin is warm and dry.  ?   Capillary Refill: Capillary refill takes less than 2 seconds.  ?Neurological:  ?   General: No focal deficit present.  ?   Mental Status: She is alert and oriented to person, place, and time.  ?  Psychiatric:     ?   Mood and Affect: Mood normal.     ?   Behavior: Behavior normal.  ? ?  ? ?Lab Results  ?Component Value Date  ? WBC 6.4 02/10/2022  ? HGB 14.5 02/10/2022  ? HCT 41.7 02/10/2022  ? PLT 253 02/10/2022  ? GLUCOSE 135 (H) 02/10/2022  ? CHOL 292 (H) 02/10/2022  ? TRIG 152 (H) 02/10/2022  ? HDL 55 02/10/2022  ? LDLCALC 209 (H) 02/10/2022  ? ALT 119 (H) 02/10/2022  ? AST 66 (H) 02/10/2022  ? NA 142 02/10/2022  ? K 4.9 02/10/2022  ? CL 101 02/10/2022  ? CREATININE 0.66 02/10/2022  ? BUN 7 02/10/2022  ? CO2 24 02/10/2022  ? TSH 0.856 02/10/2022  ? HGBA1C 7.3 (H) 02/10/2022  ? ? ? ? ?Assessment & Plan:  ? ? 1. Dysthymic disorder ?- vortioxetine HBr (TRINTELLIX) 5 MG TABS tablet; Take 1 tablet (5 mg total) by mouth daily.  Dispense: 21 tablet; Refill: 0 ?- venlafaxine XR (EFFEXOR XR) 37.5 MG 24 hr capsule; Take 1 capsule (37.5 mg total) by mouth daily with breakfast.  Dispense: 30 capsule; Refill: 0 ?-Gen[facility] genetic testing ? ?2. Binge eating disorder ?-[facility] genetic testing ?-recommend counseling ? ?3. Type 2 diabetes mellitus with hyperglycemia, without long-term current use of insulin ([place]) ?- Semaglutide, 2 MG/DOSE, (OZEMPIC, 2 MG/DOSE,) 8 MG/3ML SOPN; Inject 2 mg into the skin once a week.  Dispense: 3 mL; Refill: 3 ?  ? ? ? ?Take Effexor 37.5 mg two tablets daily for 7 days, then decrease to one tablet daily for 7 days, then decrease to one tablet every other day for 7 days, then discontinue medication ? ?Begin Trintellix 5 mg daily until you begin Effexor 37.5 mg, then increase Trintellix to 10 mg daily ?Monitor for  serotonin syndrome ?Notify office immediately of any adverse side effects ?Return in 2 weeks review [facility] testing ? ?  ? ?Follow-up: 2-weeks ? ?An After Visit Summary was printed and given to the patient. ? ?

## 2022-03-10 ENCOUNTER — Ambulatory Visit: Payer: 59 | Admitting: Nurse Practitioner

## 2022-03-10 ENCOUNTER — Encounter: Payer: Self-pay | Admitting: Nurse Practitioner

## 2022-03-10 VITALS — BP 136/80 | HR 106 | Temp 95.5°F | Ht 62.0 in | Wt 227.0 lb

## 2022-03-10 DIAGNOSIS — E1165 Type 2 diabetes mellitus with hyperglycemia: Secondary | ICD-10-CM

## 2022-03-10 DIAGNOSIS — F5081 Binge eating disorder: Secondary | ICD-10-CM

## 2022-03-10 DIAGNOSIS — F341 Dysthymic disorder: Secondary | ICD-10-CM

## 2022-03-10 MED ORDER — VORTIOXETINE HBR 5 MG PO TABS
5.0000 mg | ORAL_TABLET | Freq: Every day | ORAL | 0 refills | Status: DC
Start: 2022-03-10 — End: 2022-04-18

## 2022-03-10 MED ORDER — VENLAFAXINE HCL ER 37.5 MG PO CP24: 37.5000 mg | ORAL_CAPSULE | Freq: Every day | ORAL | 0 refills | Status: DC

## 2022-03-10 MED ORDER — OZEMPIC (2 MG/DOSE) 8 MG/3ML ~~LOC~~ SOPN: 2.0000 mg | PEN_INJECTOR | SUBCUTANEOUS | 3 refills | Status: DC

## 2022-03-10 NOTE — Patient Instructions (Addendum)
Take Effexor 37.5 mg two tablets daily for 7 days, then decrease to one tablet daily for 7 days, then decrease to one tablet every other day for 7 days, then discontinue medication ? ?Begin Trintellix 5 mg daily until you begin Effexor 37.5 mg, then increase Trintellix to 10 mg daily ?Monitor for serotonin syndrome ?Notify office immediately of any adverse side effects ?Return in 2 weeks review Genes[facility] testing ? ? ?Serotonin Syndrome ?Serotonin is a chemical in your body (neurotransmitter) that helps to control several functions, such as: ?Brain and nerve cell function. ?Mood and emotions. ?Memory. ?Eating. ?Sleeping. ?Sexual activity. ?Stress response. ?Having too much serotonin in your body can cause serotonin syndrome. This condition can be harmful to your brain and nerve cells. This can be a life-threatening condition. ?What are the causes? ?This condition may be caused by taking medicines or drugs that increase the level of serotonin in your body, such as: ?Antidepressant medicines. ?Migraine medicines. ?Certain pain medicines. ?Certain drugs, including ecstasy, LSD, cocaine, and amphetamines. ?Over-the-counter cough or cold medicines that contain dextromethorphan. ?Certain herbal supplements, including St. John's wort, ginseng, and nutmeg. ?This condition usually occurs when you take these medicines or drugs in combination, but it can also happen with a high dose of a single medicine or drug. ?What increases the risk? ?You are more likely to develop this condition if: ?You just started taking a medicine or drug that increases the level of serotonin in the body. ?You recently increased the dose of a medicine or drug that increases the level of serotonin in the body. ?You take more than one medicine or drug that increases the level of serotonin in the body. ?What are the signs or symptoms? ?Symptoms of this condition usually start within several hours of taking a medicine or drug. Symptoms may be mild or  severe. Mild symptoms include: ?Sweating. ?Restlessness or agitation. ?Muscle twitching or stiffness. ?Rapid heart rate. ?Nausea and vomiting. ?Diarrhea. ?Headache. ?Shivering or goose bumps. ?Confusion. ?Severe symptoms include: ?Irregular heartbeat. ?Seizures. ?Loss of consciousness. ?High fever. ?How is this diagnosed? ?This condition may be diagnosed based on: ?Your medical history.  ?A physical exam. ?Your prior use of drugs and medicines. ?Blood or urine tests. These may be used to rule out other causes of your symptoms. ?How is this treated? ?The treatment for this condition depends on the severity of your symptoms. ?For mild cases, stopping the medicine or drug that caused your condition is usually all that is needed. ?For moderate to severe cases, treatment in a hospital may be needed to prevent or manage life-threatening symptoms. This may include medicines to control your symptoms, IV fluids, interventions to support your breathing, and treatments to control your body temperature. ?Follow these instructions at home: ?Medicines ? ?Take over-the-counter and prescription medicines only as told by your health care provider. This is important. ?Check with your health care provider before you start taking any new prescriptions, over-the-counter medicines, herbs, or supplements. ?Avoid combining any medicines that can cause this condition to occur. ?Lifestyle ? ?Maintain a healthy lifestyle. ?Eat a healthy diet that includes plenty of vegetables, fruits, whole grains, low-fat dairy products, and lean protein. Do not eat a lot of foods that are high in fat, added sugars, or salt. ?Get the right amount and quality of sleep. Most adults need 7-9 hours of sleep each night. ?Make time to exercise, even if it is only for short periods of time. Most adults should exercise for at least 150 minutes each week. ?Do not  drink alcohol. ?Do not use illegal drugs, and do not take medicines for reasons other than they are  prescribed. ?General instructions ?Do not use any products that contain nicotine or tobacco, such as cigarettes and e-cigarettes. If you need help quitting, ask your health care provider. ?Keep all follow-up visits as told by your health care provider. This is important. ?Contact a health care provider if: ?Your symptoms do not improve or they get worse. ?Get help right away if you: ?Have worsening confusion, severe headache, chest pain, high fever, seizures, or loss of consciousness. ?Experience serious side effects of medicine, such as swelling of your face, lips, tongue, or throat. ?Have serious thoughts about hurting yourself or others. ?These symptoms may represent a serious problem that is an emergency. Do not wait to see if the symptoms will go away. Get medical help right away. Call your local emergency services (911 in the [place] Do not drive yourself to the hospital. ?If you ever feel like you may hurt yourself or others, or have thoughts about taking your own life, get help right away. You can go to your nearest emergency department or call: ?Your local emergency services (911 in the [place] ??A suicide crisis helpline, such as the [facility] at 9204029099 or 988 in the [place]. This is open 24 hours a day. ?Summary ?Serotonin is a brain chemical that helps to regulate the nervous system. High levels of serotonin in the body can cause serotonin syndrome, which is a very dangerous condition. ?This condition may be caused by taking medicines or drugs that increase the level of serotonin in your body. ?Treatment depends on the severity of your symptoms. For mild cases, stopping the medicine or drug that caused your condition is usually all that is needed. ?Check with your health care provider before you start taking any new prescriptions, over-the-counter medicines, herbs, or supplements. ?This information is not intended to replace advice given to you by your health care provider.  Make sure you discuss any questions you have with your health care provider. ?Document Revised: 09/17/2021 Document Reviewed: 08/26/2021 ?Elsevier Patient Education ? [place]. ? ?

## 2022-03-24 NOTE — Progress Notes (Deleted)
Subjective:  Patient ID: Chelsea Freeman, female    DOB: Mar 05, 1976  Age: 46 y.o. MRN: [id number]  No chief complaint on file.   HPI   Current Outpatient Medications on File Prior to Visit  Medication Sig Dispense Refill   liothyronine (CYTOMEL) 5 MCG tablet Take 5 mcg by mouth daily.     metFORMIN (GLUCOPHAGE-XR) 500 MG 24 hr tablet Take 2 tablets (1,000 mg total) by mouth daily with breakfast. 180 tablet 1   ondansetron (ZOFRAN) 4 MG tablet Take 1 tablet (4 mg total) by mouth every 8 (eight) hours as needed for nausea or vomiting. 20 tablet 0   Semaglutide, 2 MG/DOSE, (OZEMPIC, 2 MG/DOSE,) 8 MG/3ML SOPN Inject 2 mg into the skin once a week. 3 mL 3   venlafaxine XR (EFFEXOR XR) 37.5 MG 24 hr capsule Take 1 capsule (37.5 mg total) by mouth daily with breakfast. 30 capsule 0   Vitamin D, Ergocalciferol, (DRISDOL) 1.25 MG (50000 UNIT) CAPS capsule Take 1 capsule (50,000 Units total) by mouth every 7 (seven) days. 5 capsule 3   vortioxetine HBr (TRINTELLIX) 5 MG TABS tablet Take 1 tablet (5 mg total) by mouth daily. 21 tablet 0   No current facility-administered medications on file prior to visit.   Past Medical History:  Diagnosis Date   Abnormal uterine bleeding (AUB) 06/28/2016   Anxiety disorder    Cellulitis    Dysmenorrhea 06/28/2016   Dysthymic disorder    Fatigue    Hyperlipidemia    Hyperprolactinemia ([facility])    Hypoxemia    Increased frequency of urination 06/28/2016   Intramural leiomyoma of uterus 06/28/2016   Obesity    Prediabetes    Sebaceous cyst    Sleep apnea    Type 2 diabetes mellitus without complications ([place])    Urge incontinence 06/28/2016   Past Surgical History:  Procedure Laterality Date   ABDOMINAL HYSTERECTOMY      Family History  Problem Relation Age of Onset   Hypertension Mother    Depression Mother    Atrial fibrillation Mother    Hypertension Father    Diabetes Father    Heart Problems Father    Parkinson's disease Father    Atrial  fibrillation Sister    Thyroid disease Sister    Cancer Sister        Breast-remission   Heart Problems Sister    Breast cancer Sister    Bipolar disorder Sister    Kidney failure Sister    Cancer Maternal Grandfather        Leukemia   Heart Problems Paternal Grandmother    Heart failure Paternal Grandmother    Stroke Paternal Grandfather    Heart Problems Paternal Grandfather    Social History   Socioeconomic History   Marital status: Married    Spouse name: Rylei Masella   Number of children: 2   Years of education: Not on file   Highest education level: Not on file  Occupational History   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  Tobacco Use   Smoking status: Never   Smokeless tobacco: Never  Substance and Sexual Activity   Alcohol use: Not Currently   Drug use: Not Currently   Sexual activity: Yes    Partners: Male  Other Topics Concern   Not on file  Social History Narrative   ** Merged History Encounter **       Social Determinants of Health   Financial Resource Strain: Low Risk    Difficulty of  Paying Living Expenses: Not hard at all  Food Insecurity: No Food Insecurity   Worried About [place] in the Last Year: Never true   Ran Out of Food in the Last Year: Never true  Transportation Needs: No Transportation Needs   Lack of Transportation (Medical): No   Lack of Transportation (Non-Medical): No  Physical Activity: Not on file  Stress: No Stress Concern Present   Feeling of Stress : Not at all  Social Connections: Moderately Isolated   Frequency of Communication with Friends and Family: More than three times a week   Frequency of Social Gatherings with Friends and Family: More than three times a week   Attends Religious Services: Never   Marine scientist or Organizations: No   Attends Archivist Meetings: Never   Marital Status: Married    Review of Systems   Objective:  LMP  (LMP Unknown)      03/10/2022    9:59 AM 02/10/2022    11:03 AM 07/31/2020    1:08 PM  BP/Weight  Systolic BP 983 382 505  Diastolic BP 80 74 59  Wt. (Lbs) 227 229   BMI 41.52 kg/m2 41.88 kg/m2     Physical Exam  Diabetic Foot Exam - Simple   No data filed      Lab Results  Component Value Date   WBC 6.4 02/10/2022   HGB 14.5 02/10/2022   HCT 41.7 02/10/2022   PLT 253 02/10/2022   GLUCOSE 135 (H) 02/10/2022   CHOL 292 (H) 02/10/2022   TRIG 152 (H) 02/10/2022   HDL 55 02/10/2022   LDLCALC 209 (H) 02/10/2022   ALT 119 (H) 02/10/2022   AST 66 (H) 02/10/2022   NA 142 02/10/2022   K 4.9 02/10/2022   CL 101 02/10/2022   CREATININE 0.66 02/10/2022   BUN 7 02/10/2022   CO2 24 02/10/2022   TSH 0.856 02/10/2022   HGBA1C 7.3 (H) 02/10/2022      Assessment & Plan:   Problem List Items Addressed This Visit   None .  No orders of the defined types were placed in this encounter.   No orders of the defined types were placed in this encounter.    Follow-up: No follow-ups on file.  An After Visit Summary was printed and given to the patient.  Rip Harbour, NP [place] [phone]

## 2022-03-25 ENCOUNTER — Ambulatory Visit: Payer: 59 | Admitting: Nurse Practitioner

## 2022-03-25 DIAGNOSIS — F341 Dysthymic disorder: Secondary | ICD-10-CM

## 2022-03-30 ENCOUNTER — Ambulatory Visit
Admission: RE | Admit: 2022-03-30 | Discharge: 2022-03-30 | Disposition: A | Discharge status: Home / Self Care | Payer: 59 | Source: Ambulatory Visit | Attending: Nurse Practitioner | Admitting: Nurse Practitioner

## 2022-03-30 ENCOUNTER — Other Ambulatory Visit: Payer: Self-pay | Admitting: Nurse Practitioner

## 2022-03-30 DIAGNOSIS — Z1231 Encounter for screening mammogram for malignant neoplasm of breast: Secondary | ICD-10-CM

## 2022-04-07 ENCOUNTER — Ambulatory Visit: Payer: 59 | Admitting: Nurse Practitioner

## 2022-04-07 ENCOUNTER — Encounter: Payer: Self-pay | Admitting: Nurse Practitioner

## 2022-04-07 VITALS — BP 128/68 | HR 102 | Temp 97.3°F | Ht 62.0 in | Wt 232.0 lb

## 2022-04-07 DIAGNOSIS — F341 Dysthymic disorder: Secondary | ICD-10-CM | POA: Diagnosis not present

## 2022-04-07 MED ORDER — CARIPRAZINE HCL 1.5 MG PO CAPS: 1.5000 mg | ORAL_CAPSULE | Freq: Every day | ORAL | 0 refills | Status: DC

## 2022-04-07 MED ORDER — ONDANSETRON 4 MG PO TBDP: 4.0000 mg | ORAL_TABLET | Freq: Three times a day (TID) | ORAL | 1 refills | Status: DC | PRN

## 2022-04-07 NOTE — Patient Instructions (Addendum)
Stop Trintellix Begin Vraylar 1.5 mg daily Recommend counseling Notify office immediately of any adverse side effects Take Zofran as needed for nausea Return in 4 weeks for follow-up  Managing Depression, Adult Depression is a mental health condition that affects your thoughts, feelings, and actions. Being diagnosed with depression can bring you relief if you did not know why you have felt or behaved a certain way. It could also leave you feeling overwhelmed with uncertainty about your future. Preparing yourself to manage your symptoms can help you feel more positive about your future. How to manage lifestyle changes Managing stress  Stress is your body's reaction to life changes and events, both good and bad. Stress can add to your feelings of depression. Learning to manage your stress can help lessen your feelings of depression. Try some of the following approaches to reducing your stress (stress reduction techniques): Listen to music that you enjoy and that inspires you. Try using a meditation app or take a meditation class. Develop a practice that helps you connect with your spiritual self. Walk in nature, pray, or go to a place of worship. Do some deep breathing. To do this, inhale slowly through your nose. Pause at the top of your inhale for a few seconds and then exhale slowly, letting your muscles relax. Practice yoga to help relax and work your muscles. Choose a stress reduction technique that suits your lifestyle and personality. These techniques take time and practice to develop. Set aside 5-15 minutes a day to do them. Therapists can offer training in these techniques. Other things you can do to manage stress include: Keeping a stress diary. Knowing your limits and saying no when you think something is too much. Paying attention to how you react to certain situations. You may not be able to control everything, but you can change your reaction. Adding humor to your life by watching  funny films or TV shows. Making time for activities that you enjoy and that relax you.  Medicines Medicines, such as antidepressants, are often a part of treatment for depression. Talk with your pharmacist or health care provider about all the medicines, supplements, and herbal products that you take, their possible side effects, and what medicines and other products are safe to take together. Make sure to report any side effects you may have to your health care provider. Relationships Your health care provider may suggest family therapy, couples therapy, or individual therapy as part of your treatment. How to recognize changes Everyone responds differently to treatment for depression. As you recover from depression, you may start to: Have more interest in doing activities. Feel less hopeless. Have more energy. Overeat less often, or have a better appetite. Have better mental focus. It is important to recognize if your depression is not getting better or is getting worse. The symptoms you had in the beginning may return, such as: Tiredness (fatigue) or low energy. Eating too much or too little. Sleeping too much or too little. Feeling restless, agitated, or hopeless. Trouble focusing or making decisions. Unexplained physical complaints. Feeling irritable, angry, or aggressive. If you or your family members notice these symptoms coming back, let your health care provider know right away. Follow these instructions at home: Activity  Try to get some form of exercise each day, such as walking, biking, swimming, or lifting weights. Practice stress reduction techniques. Engage your mind by taking a class or doing some volunteer work. Lifestyle Get the right amount and quality of sleep. Cut down on using  caffeine, tobacco, alcohol, and other potentially harmful substances. Eat a healthy diet that includes plenty of vegetables, fruits, whole grains, low-fat dairy products, and lean protein.  Do not eat a lot of foods that are high in solid fats, added sugars, or salt (sodium). General instructions Take over-the-counter and prescription medicines only as told by your health care provider. Keep all follow-up visits as told by your health care provider. This is important. Where to find support Talking to others  Friends and family members can be sources of support and guidance. Talk to trusted friends or family members about your condition. Explain your symptoms to them, and let them know that you are working with a health care provider to treat your depression. Tell friends and family members how they also can be helpful. Finances Find appropriate mental health providers that fit with your financial situation. Talk with your health care provider about options to get reduced prices on your medicines. Where to find more information You can find support in your area from: Anxiety and Depression Association of America (ADAA): www.adaa.org Mental Health America: www.mentalhealthamerica.net [employer] on Mental Illness: www.nami.org Contact a health care provider if: You stop taking your antidepressant medicines, and you have any of these symptoms: Nausea. Headache. Light-headedness. Chills and body aches. Not being able to sleep (insomnia). You or your friends and family think your depression is getting worse. Get help right away if: You have thoughts of hurting yourself or others. If you ever feel like you may hurt yourself or others, or have thoughts about taking your own life, get help right away. Go to your nearest emergency department or: Call your local emergency services (911 in the [place] Call a suicide crisis helpline, such as the [place] at 772 247 5474 or 988 in the [place]. This is open 24 hours a day in the [place] Text the Crisis Text Line at (909) 555-9905 (in the [place].). Summary If you are diagnosed with depression, preparing yourself to  manage your symptoms is a good way to feel positive about your future. Work with your health care provider on a management plan that includes stress reduction techniques, medicines (if applicable), therapy, and healthy lifestyle habits. Keep talking with your health care provider about how your treatment is working. If you have thoughts about taking your own life, call a suicide crisis helpline or text a crisis text line. This information is not intended to replace advice given to you by your health care provider. Make sure you discuss any questions you have with your health care provider. Document Revised: 05/19/2021 Document Reviewed: 09/04/2019 Elsevier Patient Education  [place] What is this medication? CARIPRAZINE (car i PRA zeen) treats schizophrenia and bipolar disorder. It may also be used with antidepressant medication to treat depression. It works by balancing the levels of dopamine and serotonin in your brain, substances that help regulate mood, behaviors, and thoughts. It belongs to a group of medications called antipsychotics. Antipsychotic medications can be used to treat several kinds of mental health conditions. This medicine may be used for other purposes; ask your health care provider or pharmacist if you have questions. COMMON BRAND NAME(S): VRAYLAR What should I tell my care team before I take this medication? They need to know if you have any of these conditions: Dementia Diabetes Difficulty swallowing Have trouble controlling your muscles Heart disease High cholesterol History of breast cancer History of stroke Kidney disease Liver disease Low blood counts, like low white cell, platelet,  or red cell counts Low blood pressure Parkinson's disease Seizures Suicidal thoughts, plans or attempt; a previous suicide attempt by you or a family member An unusual or allergic reaction to cariprazine, other medications, foods, dyes, or  preservatives Pregnant or trying to get pregnant Breast-feeding How should I use this medication? Take this medication by mouth with a glass of water. Follow the directions on the prescription label. You may take it with or without food. Take your medication at regular intervals. Do not take it more often than directed. Do not stop taking except on your care team's advice. A special MedGuide will be given to you by the pharmacist with each prescription and refill. Be sure to read this information carefully each time. Talk to your care team about the use of this medication in children. Special care may be needed. Overdosage: If you think you have taken too much of this medicine contact a poison control center or emergency room at once. NOTE: This medicine is only for you. Do not share this medicine with others. What if I miss a dose? If you miss a dose, take it as soon as you can. If it is almost time for your next dose, take only that dose. Do not take double or extra doses. What may interact with this medication? Do not take this medication with any of the following: Metoclopramide This medication may also interact with the following: Antihistamines for allergy, cough, and cold Carbamazepine Certain medications for anxiety or sleep Certain medications for depression like amitriptyline, fluoxetine, sertraline Certain medications for fungal infections like itraconazole, ketoconazole General anesthetics like halothane, isoflurane, methoxyflurane, propofol Levodopa or other medications for Parkinson's disease Medications for blood pressure Medications for seizures Medications that relax muscles for surgery Narcotic medications for pain Phenothiazines like chlorpromazine, prochlorperazine, thioridazine Rifampin This list may not describe all possible interactions. Give your health care provider a list of all the medicines, herbs, non-prescription drugs, or dietary supplements you use. Also  tell them if you smoke, drink alcohol, or use illegal drugs. Some items may interact with your medicine. What should I watch for while using this medication? Visit your care team for regular checks on your progress. Tell your care team if symptoms do not start to get better or if they get worse. Do not stop taking except on your care team's advice. You may develop a severe reaction. Your care team will tell you how much medication to take. Patients and their families should watch out for new or worsening depression or thoughts of suicide. Also watch out for sudden changes in feelings such as feeling anxious, agitated, panicky, irritable, hostile, aggressive, impulsive, severely restless, overly excited and hyperactive, or not being able to sleep. If this happens, especially at the beginning of treatment or after a change in dose, call your care team. You may get dizzy or drowsy. Do not drive, use machinery, or do anything that needs mental alertness until you know how this medication affects you. Do not stand or sit up quickly, especially if you are an older patient. This reduces the risk of dizzy or fainting spells. Alcohol may interfere with the effect of this medication. Avoid alcoholic drinks. This medication may cause dry eyes and blurred vision. If you wear contact lenses you may feel some discomfort. Lubricating drops may help. See your eye doctor if the problem does not go away or is severe. This medication may increase blood sugar. Ask your care team if changes in diet or medications are needed  if you have diabetes. This medication can cause problems with controlling your body temperature. It can lower the response of your body to cold temperatures. If possible, stay indoors during cold weather. If you must go outdoors, wear warm clothes. It can also lower the response of your body to heat. Do not overheat. Do not over-exercise. Stay out of the sun when possible. If you must be in the sun, wear cool  clothing. Drink plenty of water. If you have trouble controlling your body temperature, call your care team right away. Women should inform their care team if they wish to become pregnant or think they might be pregnant. The effects of this medication on an unborn child are not known. A registry is available to monitor pregnancy outcomes in pregnant women exposed to this medication or similar medications. Talk to your care team or pharmacist for more information. What side effects may I notice from receiving this medication? Side effects that you should report to your care team as soon as possible: Allergic reactions--skin rash, itching, hives, swelling of the face, lips, tongue, or throat High blood sugar (hyperglycemia)--increased thirst or amount of urine, unusual weakness or fatigue, blurry vision High fever, stiff muscles, increased sweating, fast or irregular heartbeat, and confusion, which may be signs of neuroleptic malignant syndrome Infection--fever, chills, cough, or sore throat Low blood pressure--dizziness, feeling faint or lightheaded, blurry vision Pain or trouble swallowing Seizures Stroke--sudden numbness or weakness of the face, arm, or leg, trouble speaking, confusion, trouble walking, loss of balance or coordination, dizziness, severe headache, change in vision Thoughts of suicide or self-harm, worsening mood, feelings of depression Uncontrolled and repetitive body movements, muscle stiffness or spasms, tremors or shaking, loss of balance or coordination, restlessness, shuffling walk, which may be signs of extrapyramidal symptoms (EPS) Side effects that usually do not require medical attention (report to your care team if they continue or are bothersome): Constipation Dizziness Drowsiness Nausea Trouble sleeping Upset stomach Vomiting This list may not describe all possible side effects. Call your doctor for medical advice about side effects. You may report side effects to  FDA at 1-800-FDA-1088. Where should I keep my medication? Keep out of the reach of children. Store at room temperature between 15 and 30 degrees C (59 and 86 degrees F). Protect from light. Throw away any unused medication after the expiration date. NOTE: This sheet is a summary. It may not cover all possible information. If you have questions about this medicine, talk to your doctor, pharmacist, or health care provider.  2023 Elsevier/Gold Standard (2021-11-11 00:00:00)

## 2022-04-07 NOTE — Progress Notes (Signed)
Subjective:  Patient ID: Chelsea Freeman, female    DOB: September 16, 1976  Age: 46 y.o. MRN: [id number]  Chief Complaint  Patient presents with   Depression    Genesight results   HPI: Chelsea Freeman is a 46 year old Caucasian female that presents for follow-up of anxiety and depression medication and to review Genesight genetic testing results. States she has gained weight since last visit 4 weeks ago and has been irritable. She is concerned with mental health medications causing weight gain. States her sister has bipolar depression.     Depression, Follow-up  She  was last seen for this 2 weeks ago. Changes made at last visit include d/c effexor. Start Trintellix 5 mg.   She reports excellent compliance with treatment. She is having side effects. 10 mg causes nausea but 5 mg "feels like taking a placebo"  She reports good tolerance of treatment. Current symptoms include:  irritability She feels she is Unchanged since last visit.    Genesight Test results:  Use as Directed:  Anafranil Norpramin Pristiq Sinequan Prozac Tofranil Fetzima Pamelor Desyrel Effexor Viibryd Trintellix  Moderate Gene-Drug Interaction: Elavil Celexa Lexapro  Significant Gene-drug Interaction: Wellbutrin Emsam Cymbalta Luvox Remeron Paxil Zoloft      02/10/2022   11:17 AM  Depression screen PHQ 2/9  Decreased Interest 0  Down, Depressed, Hopeless 0  PHQ - 2 Score 0  Altered sleeping 0  Tired, decreased energy 3  Change in appetite 3  Feeling bad or failure about yourself  0  Trouble concentrating 0  Moving slowly or fidgety/restless 0  Suicidal thoughts 0  PHQ-9 Score 6  Difficult doing work/chores Not difficult at all      Current Outpatient Medications on File Prior to Visit  Medication Sig Dispense Refill   liothyronine (CYTOMEL) 5 MCG tablet Take 5 mcg by mouth daily.     metFORMIN (GLUCOPHAGE-XR) 500 MG 24 hr tablet Take 2 tablets (1,000 mg total) by mouth daily with  breakfast. 180 tablet 1   ondansetron (ZOFRAN) 4 MG tablet Take 1 tablet (4 mg total) by mouth every 8 (eight) hours as needed for nausea or vomiting. 20 tablet 0   Semaglutide, 2 MG/DOSE, (OZEMPIC, 2 MG/DOSE,) 8 MG/3ML SOPN Inject 2 mg into the skin once a week. 3 mL 3   Vitamin D, Ergocalciferol, (DRISDOL) 1.25 MG (50000 UNIT) CAPS capsule Take 1 capsule (50,000 Units total) by mouth every 7 (seven) days. 5 capsule 3   vortioxetine HBr (TRINTELLIX) 5 MG TABS tablet Take 1 tablet (5 mg total) by mouth daily. 21 tablet 0   No current facility-administered medications on file prior to visit.   Past Medical History:  Diagnosis Date   Abnormal uterine bleeding (AUB) 06/28/2016   Anxiety disorder    Cellulitis    Dysmenorrhea 06/28/2016   Dysthymic disorder    Fatigue    Hyperlipidemia    Hyperprolactinemia (HCC)    Hypoxemia    Increased frequency of urination 06/28/2016   Intramural leiomyoma of uterus 06/28/2016   Obesity    Prediabetes    Sebaceous cyst    Sleep apnea    Type 2 diabetes mellitus without complications ([place])    Urge incontinence 06/28/2016   Past Surgical History:  Procedure Laterality Date   ABDOMINAL HYSTERECTOMY      Family History  Problem Relation Age of Onset   Hypertension Mother    Depression Mother    Atrial fibrillation Mother    Hypertension Father    Diabetes Father  Heart Problems Father    Parkinson's disease Father    Atrial fibrillation Sister    Thyroid disease Sister    Cancer Sister        Breast-remission   Heart Problems Sister    Breast cancer Sister    Bipolar disorder Sister    Kidney failure Sister    Cancer Maternal Grandfather        Leukemia   Heart Problems Paternal Grandmother    Heart failure Paternal Grandmother    Stroke Paternal Grandfather    Heart Problems Paternal Grandfather    Social History   Socioeconomic History   Marital status: Married    Spouse name: Timmi Devora   Number of children: 2   Years of  education: Not on file   Highest education level: Not on file  Occupational History   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  Tobacco Use   Smoking status: Never   Smokeless tobacco: Never  Substance and Sexual Activity   Alcohol use: Not Currently   Drug use: Not Currently   Sexual activity: Yes    Partners: Male  Other Topics Concern   Not on file  Social History Narrative   ** Merged History Encounter **       Social Determinants of Health   Financial Resource Strain: Low Risk    Difficulty of Paying Living Expenses: Not hard at all  Food Insecurity: No Food Insecurity   Worried About Charity fundraiser in the Last Year: Never true   [place] in the Last Year: Never true  Transportation Needs: No Transportation Needs   Lack of Transportation (Medical): No   Lack of Transportation (Non-Medical): No  Physical Activity: Not on file  Stress: No Stress Concern Present   Feeling of Stress : Not at all  Social Connections: Moderately Isolated   Frequency of Communication with Friends and Family: More than three times a week   Frequency of Social Gatherings with Friends and Family: More than three times a week   Attends Religious Services: Never   Marine scientist or Organizations: No   Attends Archivist Meetings: Never   Marital Status: Married    Review of Systems  Constitutional:  Negative for chills, fatigue and fever.  HENT:  Negative for congestion, ear pain, rhinorrhea and sore throat.   Respiratory:  Negative for cough and shortness of breath.   Cardiovascular:  Negative for chest pain.  Psychiatric/Behavioral:  Positive for agitation (irritability) and depression.     Objective:  Pulse (!) 102   Temp (!) 97.3 F (36.3 C)   Ht '5\' 2"'$  (1.575 m)   Wt 232 lb (105.2 kg)   LMP  (LMP Unknown)   SpO2 99%   BMI 42.43 kg/m      04/07/2022    8:36 AM 03/10/2022    9:59 AM 02/10/2022   11:03 AM  BP/Weight  Systolic BP  628 366  Diastolic BP  80 74  Wt.  (Lbs) 232 227 229  BMI 42.43 kg/m2 41.52 kg/m2 41.88 kg/m2    Physical Exam Vitals reviewed.  Constitutional:      Appearance: She is obese.  Skin:    General: Skin is warm and dry.     Capillary Refill: Capillary refill takes less than 2 seconds.  Neurological:     General: No focal deficit present.     Mental Status: She is alert and oriented to person, place, and time.  Psychiatric:  Mood and Affect: Mood normal.        Behavior: Behavior normal.        Lab Results  Component Value Date   WBC 6.4 02/10/2022   HGB 14.5 02/10/2022   HCT 41.7 02/10/2022   PLT 253 02/10/2022   GLUCOSE 135 (H) 02/10/2022   CHOL 292 (H) 02/10/2022   TRIG 152 (H) 02/10/2022   HDL 55 02/10/2022   LDLCALC 209 (H) 02/10/2022   ALT 119 (H) 02/10/2022   AST 66 (H) 02/10/2022   NA 142 02/10/2022   K 4.9 02/10/2022   CL 101 02/10/2022   CREATININE 0.66 02/10/2022   BUN 7 02/10/2022   CO2 24 02/10/2022   TSH 0.856 02/10/2022   HGBA1C 7.3 (H) 02/10/2022      Assessment & Plan:   1. Dysthymic disorder - ondansetron (ZOFRAN-ODT) 4 MG disintegrating tablet; Take 1 tablet (4 mg total) by mouth every 8 (eight) hours as needed for nausea or vomiting.  Dispense: 20 tablet; Refill: 1 - cariprazine (VRAYLAR) 1.5 MG capsule; Take 1 capsule (1.5 mg total) by mouth daily.  Dispense: 28 capsule; Refill: 0      Recommend counseling Notify office immediately of any adverse side effects Take Zofran as needed for nausea Return in 4 weeks for follow-up  Follow-up: 4-weeks  An After Visit Summary was printed and given to the patient.  I, Rip Harbour, NP, have reviewed all documentation for this visit. The documentation on 04/07/22 for the exam, diagnosis, procedures, and orders are all accurate and complete.   Signed, Rip Harbour, NP [place] [phone]

## 2022-04-08 ENCOUNTER — Ambulatory Visit: Payer: 59 | Admitting: Nurse Practitioner

## 2022-04-12 ENCOUNTER — Encounter: Payer: Self-pay | Admitting: Nurse Practitioner

## 2022-04-12 ENCOUNTER — Other Ambulatory Visit: Payer: Self-pay | Admitting: Nurse Practitioner

## 2022-04-12 DIAGNOSIS — R748 Abnormal levels of other serum enzymes: Secondary | ICD-10-CM

## 2022-04-12 DIAGNOSIS — R1011 Right upper quadrant pain: Secondary | ICD-10-CM

## 2022-04-13 ENCOUNTER — Other Ambulatory Visit: Payer: Self-pay | Admitting: Nurse Practitioner

## 2022-04-13 DIAGNOSIS — Z8379 Family history of other diseases of the digestive system: Secondary | ICD-10-CM

## 2022-04-13 DIAGNOSIS — R748 Abnormal levels of other serum enzymes: Secondary | ICD-10-CM

## 2022-04-13 DIAGNOSIS — K828 Other specified diseases of gallbladder: Secondary | ICD-10-CM

## 2022-04-13 DIAGNOSIS — E1165 Type 2 diabetes mellitus with hyperglycemia: Secondary | ICD-10-CM

## 2022-04-13 DIAGNOSIS — K7689 Other specified diseases of liver: Secondary | ICD-10-CM

## 2022-04-13 MED ORDER — METFORMIN HCL 1000 MG PO TABS: 1000.0000 mg | ORAL_TABLET | Freq: Two times a day (BID) | ORAL | 3 refills | Status: DC

## 2022-04-18 ENCOUNTER — Other Ambulatory Visit: Payer: Self-pay

## 2022-04-18 ENCOUNTER — Other Ambulatory Visit: Payer: Self-pay | Admitting: Nurse Practitioner

## 2022-04-18 DIAGNOSIS — R110 Nausea: Secondary | ICD-10-CM

## 2022-04-18 DIAGNOSIS — F411 Generalized anxiety disorder: Secondary | ICD-10-CM

## 2022-04-18 DIAGNOSIS — R748 Abnormal levels of other serum enzymes: Secondary | ICD-10-CM

## 2022-04-18 DIAGNOSIS — R1011 Right upper quadrant pain: Secondary | ICD-10-CM

## 2022-04-18 MED ORDER — ALPRAZOLAM 0.5 MG PO TABS: 0.5000 mg | ORAL_TABLET | Freq: Two times a day (BID) | ORAL | 0 refills | Status: DC | PRN

## 2022-04-18 MED ORDER — ONDANSETRON HCL 4 MG PO TABS: 4.0000 mg | ORAL_TABLET | Freq: Three times a day (TID) | ORAL | 0 refills | Status: DC | PRN

## 2022-04-27 ENCOUNTER — Encounter: Payer: Self-pay | Admitting: Nurse Practitioner

## 2022-05-03 ENCOUNTER — Encounter: Payer: Self-pay | Admitting: Nurse Practitioner

## 2022-05-03 ENCOUNTER — Ambulatory Visit: Payer: 59 | Admitting: Nurse Practitioner

## 2022-05-03 VITALS — BP 142/78 | HR 105 | Temp 96.7°F | Ht 62.0 in | Wt 229.0 lb

## 2022-05-03 DIAGNOSIS — F341 Dysthymic disorder: Secondary | ICD-10-CM

## 2022-05-03 DIAGNOSIS — E1165 Type 2 diabetes mellitus with hyperglycemia: Secondary | ICD-10-CM | POA: Diagnosis not present

## 2022-05-03 DIAGNOSIS — R748 Abnormal levels of other serum enzymes: Secondary | ICD-10-CM

## 2022-05-03 MED ORDER — DAPAGLIFLOZIN PROPANEDIOL 10 MG PO TABS: 10.0000 mg | ORAL_TABLET | Freq: Every day | ORAL | 0 refills | Status: DC

## 2022-05-03 MED ORDER — DESVENLAFAXINE SUCCINATE ER 50 MG PO TB24: 100.0000 mg | ORAL_TABLET | Freq: Every day | ORAL | 0 refills | Status: DC

## 2022-05-04 LAB — COMPREHENSIVE METABOLIC PANEL
ALT: 57 IU/L — ABNORMAL HIGH (ref 0–32)
AST: 28 IU/L (ref 0–40)
Albumin/Globulin Ratio: 1.7 (ref 1.2–2.2)
Albumin: 4.5 g/dL (ref 3.8–4.8)
Alkaline Phosphatase: 75 IU/L (ref 44–121)
BUN/Creatinine Ratio: 10 (ref 9–23)
BUN: 7 mg/dL (ref 6–24)
Bilirubin Total: 0.3 mg/dL (ref 0.0–1.2)
CO2: 22 mmol/L (ref 20–29)
Calcium: 9.8 mg/dL (ref 8.7–10.2)
Chloride: 102 mmol/L (ref 96–106)
Creatinine, Ser: 0.73 mg/dL (ref 0.57–1.00)
Globulin, Total: 2.7 g/dL (ref 1.5–4.5)
Glucose: 163 mg/dL — ABNORMAL HIGH (ref 70–99)
Potassium: 4.7 mmol/L (ref 3.5–5.2)
Sodium: 140 mmol/L (ref 134–144)
Total Protein: 7.2 g/dL (ref 6.0–8.5)
eGFR: 103 mL/min/{1.73_m2} (ref >59)

## 2022-05-05 ENCOUNTER — Ambulatory Visit: Payer: 59 | Admitting: Nurse Practitioner

## 2022-06-01 ENCOUNTER — Ambulatory Visit: Payer: 59 | Admitting: Nurse Practitioner

## 2022-06-01 ENCOUNTER — Encounter: Payer: Self-pay | Admitting: Nurse Practitioner

## 2022-06-01 VITALS — BP 138/84 | HR 86 | Temp 97.6°F | Ht 62.0 in | Wt 226.0 lb

## 2022-06-01 DIAGNOSIS — E1165 Type 2 diabetes mellitus with hyperglycemia: Secondary | ICD-10-CM | POA: Diagnosis not present

## 2022-06-01 DIAGNOSIS — F341 Dysthymic disorder: Secondary | ICD-10-CM | POA: Diagnosis not present

## 2022-06-01 MED ORDER — DAPAGLIFLOZIN PROPANEDIOL 10 MG PO TABS: 10.0000 mg | ORAL_TABLET | Freq: Every day | ORAL | 0 refills | Status: DC

## 2022-06-01 MED ORDER — DESVENLAFAXINE SUCCINATE ER 100 MG PO TB24: 100.0000 mg | ORAL_TABLET | Freq: Every day | ORAL | 3 refills | Status: DC

## 2022-06-01 NOTE — Patient Instructions (Signed)
Continue medications Consider Veozah for hot flashes Follow-up in 60-month, fasting  Managing Depression, Adult Depression is a mental health condition that affects your thoughts, feelings, and actions. Being diagnosed with depression can bring you relief if you did not know why you have felt or behaved a certain way. It could also leave you feeling overwhelmed with uncertainty about your future. Preparing yourself to manage your symptoms can help you feel more positive about your future. How to manage lifestyle changes Managing stress  Stress is your body's reaction to life changes and events, both good and bad. Stress can add to your feelings of depression. Learning to manage your stress can help lessen your feelings of depression. Try some of the following approaches to reducing your stress (stress reduction techniques): Listen to music that you enjoy and that inspires you. Try using a meditation app or take a meditation class. Develop a practice that helps you connect with your spiritual self. Walk in nature, pray, or go to a place of worship. Do some deep breathing. To do this, inhale slowly through your nose. Pause at the top of your inhale for a few seconds and then exhale slowly, letting your muscles relax. Practice yoga to help relax and work your muscles. Choose a stress reduction technique that suits your lifestyle and personality. These techniques take time and practice to develop. Set aside 5-15 minutes a day to do them. Therapists can offer training in these techniques. Other things you can do to manage stress include: Keeping a stress diary. Knowing your limits and saying no when you think something is too much. Paying attention to how you react to certain situations. You may not be able to control everything, but you can change your reaction. Adding humor to your life by watching funny films or TV shows. Making time for activities that you enjoy and that relax  you.  Medicines Medicines, such as antidepressants, are often a part of treatment for depression. Talk with your pharmacist or health care provider about all the medicines, supplements, and herbal products that you take, their possible side effects, and what medicines and other products are safe to take together. Make sure to report any side effects you may have to your health care provider. Relationships Your health care provider may suggest family therapy, couples therapy, or individual therapy as part of your treatment. How to recognize changes Everyone responds differently to treatment for depression. As you recover from depression, you may start to: Have more interest in doing activities. Feel less hopeless. Have more energy. Overeat less often, or have a better appetite. Have better mental focus. It is important to recognize if your depression is not getting better or is getting worse. The symptoms you had in the beginning may return, such as: Tiredness (fatigue) or low energy. Eating too much or too little. Sleeping too much or too little. Feeling restless, agitated, or hopeless. Trouble focusing or making decisions. Unexplained physical complaints. Feeling irritable, angry, or aggressive. If you or your family members notice these symptoms coming back, let your health care provider know right away. Follow these instructions at home: Activity  Try to get some form of exercise each day, such as walking, biking, swimming, or lifting weights. Practice stress reduction techniques. Engage your mind by taking a class or doing some volunteer work. Lifestyle Get the right amount and quality of sleep. Cut down on using caffeine, tobacco, alcohol, and other potentially harmful substances. Eat a healthy diet that includes plenty of vegetables, fruits,  whole grains, low-fat dairy products, and lean protein. Do not eat a lot of foods that are high in solid fats, added sugars, or salt  (sodium). General instructions Take over-the-counter and prescription medicines only as told by your health care provider. Keep all follow-up visits as told by your health care provider. This is important. Where to find support Talking to others  Friends and family members can be sources of support and guidance. Talk to trusted friends or family members about your condition. Explain your symptoms to them, and let them know that you are working with a health care provider to treat your depression. Tell friends and family members how they also can be helpful. Finances Find appropriate mental health providers that fit with your financial situation. Talk with your health care provider about options to get reduced prices on your medicines. Where to find more information You can find support in your area from: Anxiety and Depression Association of America (ADAA): www.adaa.org Mental Health America: www.mentalhealthamerica.net [employer] on Mental Illness: www.nami.org Contact a health care provider if: You stop taking your antidepressant medicines, and you have any of these symptoms: Nausea. Headache. Light-headedness. Chills and body aches. Not being able to sleep (insomnia). You or your friends and family think your depression is getting worse. Get help right away if: You have thoughts of hurting yourself or others. If you ever feel like you may hurt yourself or others, or have thoughts about taking your own life, get help right away. Go to your nearest emergency department or: Call your local emergency services (911 in the [place] Call a suicide crisis helpline, such as the [place] [facility] at (850)213-7011 or 988 in the [place]. This is open 24 hours a day in the [place] Text the Crisis Text Line at (602)504-0438 (in the [place].). Summary If you are diagnosed with depression, preparing yourself to manage your symptoms is a good way to feel positive about your future. Work with  your health care provider on a management plan that includes stress reduction techniques, medicines (if applicable), therapy, and healthy lifestyle habits. Keep talking with your health care provider about how your treatment is working. If you have thoughts about taking your own life, call a suicide crisis helpline or text a crisis text line. This information is not intended to replace advice given to you by your health care provider. Make sure you discuss any questions you have with your health care provider. Document Revised: 05/19/2021 Document Reviewed: 09/04/2019 Elsevier Patient Education  [place].

## 2022-06-01 NOTE — Progress Notes (Deleted)
Subjective:  Patient ID: Chelsea Freeman, female    DOB: 1975/11/16  Age: 46 y.o. MRN: [id number]  Chief Complaint  Patient presents with   Diabetes   Dysthymic disorder:     Diabetes Mellitus Type II, Follow-up  Lab Results  Component Value Date   HGBA1C 7.3 (H) 02/10/2022   Wt Readings from Last 3 Encounters:  05/03/22 229 lb (103.9 kg)  04/07/22 232 lb (105.2 kg)  03/10/22 227 lb (103 kg)   Last seen for diabetes {1-12:18279} {days/wks/mos/yrs:310907} ago.  Management  includes ***. She reports {excellent/good/fair/poor:19665} compliance with treatment. She {is/is not:21021397} having side effects. {document side effects if present:1}  Home blood sugar records: {diabetes glucometry results:16657}  Episodes of hypoglycemia? {Yes/No:20286} {enter symptoms and frequency of symptoms if yes:1}   Current insulin regiment: {enter 'none' or type of insulin and number of units taken with each dose of each insulin formulation that the patient is taking:1} Most Recent Eye Exam: *** {Current exercise:16438:::1} {Current diet habits:16563:::1}  Pertinent Labs: Lab Results  Component Value Date   CHOL 292 (H) 02/10/2022   HDL 55 02/10/2022   LDLCALC 209 (H) 02/10/2022   TRIG 152 (H) 02/10/2022   CHOLHDL 5.3 (H) 02/10/2022   Lab Results  Component Value Date   NA 140 05/03/2022   K 4.7 05/03/2022   CREATININE 0.73 05/03/2022   EGFR 103 05/03/2022   GFRNONAA 110 07/27/2020   GLUCOSE 163 (H) 05/03/2022                   Current Outpatient Medications on File Prior to Visit  Medication Sig Dispense Refill   ALPRAZolam (XANAX) 0.5 MG tablet Take 1 tablet (0.5 mg total) by mouth 2 (two) times daily as needed for anxiety. 20 tablet 0   dapagliflozin propanediol (FARXIGA) 10 MG TABS tablet Take 1 tablet (10 mg total) by mouth daily before breakfast. 90 tablet 0   desvenlafaxine (PRISTIQ) 50 MG 24 hr tablet Take 2 tablets (100 mg total) by mouth daily. Take 1  tablet (50 mg) by mouth daily for 7 days, then increase to 2 tablets (100 mg total) 60 tablet 0   metFORMIN (GLUCOPHAGE) 1000 MG tablet Take 1 tablet (1,000 mg total) by mouth 2 (two) times daily with a meal. 180 tablet 3   ondansetron (ZOFRAN) 4 MG tablet Take 1 tablet (4 mg total) by mouth every 8 (eight) hours as needed for nausea or vomiting. 20 tablet 0   Vitamin D, Ergocalciferol, (DRISDOL) 1.25 MG (50000 UNIT) CAPS capsule Take 1 capsule (50,000 Units total) by mouth every 7 (seven) days. 5 capsule 3   No current facility-administered medications on file prior to visit.   Past Medical History:  Diagnosis Date   Abnormal uterine bleeding (AUB) 06/28/2016   Anxiety disorder    Cellulitis    Dysmenorrhea 06/28/2016   Dysthymic disorder    Fatigue    Hyperlipidemia    Hyperprolactinemia (HCC)    Hypoxemia    Increased frequency of urination 06/28/2016   Intramural leiomyoma of uterus 06/28/2016   Obesity    Prediabetes    Sebaceous cyst    Sleep apnea    Type 2 diabetes mellitus without complications ([place])    Urge incontinence 06/28/2016   Past Surgical History:  Procedure Laterality Date   ABDOMINAL HYSTERECTOMY      Family History  Problem Relation Age of Onset   Hypertension Mother    Depression Mother    Atrial fibrillation Mother  Hypertension Father    Diabetes Father    Heart Problems Father    Parkinson's disease Father    Atrial fibrillation Sister    Thyroid disease Sister    Cancer Sister        Breast-remission   Heart Problems Sister    Breast cancer Sister    Bipolar disorder Sister    Kidney failure Sister    Cancer Maternal Grandfather        Leukemia   Heart Problems Paternal Grandmother    Heart failure Paternal Grandmother    Stroke Paternal Grandfather    Heart Problems Paternal Grandfather    Social History   Socioeconomic History   Marital status: Married    Spouse name: Olimpia Tinch   Number of children: 2   Years of education: Not  on file   Highest education level: Not on file  Occupational History   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  Tobacco Use   Smoking status: Never   Smokeless tobacco: Never  Substance and Sexual Activity   Alcohol use: Not Currently   Drug use: Not Currently   Sexual activity: Yes    Partners: Male  Other Topics Concern   Not on file  Social History Narrative   ** Merged History Encounter **       Social Determinants of Health   Financial Resource Strain: Low Risk  (02/10/2022)   Overall Financial Resource Strain (CARDIA)    Difficulty of Paying Living Expenses: Not hard at all  Food Insecurity: No Food Insecurity (02/10/2022)   Hunger Vital Sign    Worried About Running Out of Food in the Last Year: Never true    Ran Out of Food in the Last Year: Never true  Transportation Needs: No Transportation Needs (02/10/2022)   PRAPARE - Hydrologist (Medical): No    Lack of Transportation (Non-Medical): No  Physical Activity: Inactive (04/07/2022)   Exercise Vital Sign    Days of Exercise per Week: 0 days    Minutes of Exercise per Session: 0 min  Stress: No Stress Concern Present (02/10/2022)   [place]    Feeling of Stress : Not at all  Social Connections: Moderately Isolated (02/10/2022)   Social Connection and Isolation Panel [NHANES]    Frequency of Communication with Friends and Family: More than three times a week    Frequency of Social Gatherings with Friends and Family: More than three times a week    Attends Religious Services: Never    Marine scientist or Organizations: No    Attends Archivist Meetings: Never    Marital Status: Married    Review of Systems  Constitutional:  Negative for appetite change, fatigue and fever.  HENT:  Negative for congestion, ear pain, sinus pressure and sore throat.   Respiratory:  Negative for cough, chest tightness, shortness of breath and  wheezing.   Cardiovascular:  Negative for chest pain and palpitations.  Gastrointestinal:  Negative for abdominal pain, constipation, diarrhea, nausea and vomiting.  Genitourinary:  Negative for dysuria and hematuria.  Musculoskeletal:  Negative for arthralgias, back pain, joint swelling and myalgias.  Skin:  Negative for rash.  Neurological:  Negative for dizziness, weakness and headaches.  Psychiatric/Behavioral:  Negative for dysphoric mood. The patient is not nervous/anxious.      Objective:  LMP  (LMP Unknown)      05/03/2022    9:32 AM 04/07/2022  8:36 AM 03/10/2022    9:59 AM  BP/Weight  Systolic BP 695 072 257  Diastolic BP 78 68 80  Wt. (Lbs) 229 232 227  BMI 41.88 kg/m2 42.43 kg/m2 41.52 kg/m2    Physical Exam Vitals reviewed.  Constitutional:      Appearance: Normal appearance. She is normal weight.  Cardiovascular:     Rate and Rhythm: Normal rate and regular rhythm.     Pulses: Normal pulses.     Heart sounds: Normal heart sounds.  Pulmonary:     Effort: Pulmonary effort is normal.     Breath sounds: Normal breath sounds.  Abdominal:     General: Abdomen is flat. Bowel sounds are normal.     Palpations: Abdomen is soft.  Neurological:     Mental Status: She is alert and oriented to person, place, and time.  Psychiatric:        Mood and Affect: Mood normal.        Behavior: Behavior normal.     Diabetic Foot Exam - Simple   No data filed      Lab Results  Component Value Date   WBC 6.4 02/10/2022   HGB 14.5 02/10/2022   HCT 41.7 02/10/2022   PLT 253 02/10/2022   GLUCOSE 163 (H) 05/03/2022   CHOL 292 (H) 02/10/2022   TRIG 152 (H) 02/10/2022   HDL 55 02/10/2022   LDLCALC 209 (H) 02/10/2022   ALT 57 (H) 05/03/2022   AST 28 05/03/2022   NA 140 05/03/2022   K 4.7 05/03/2022   CL 102 05/03/2022   CREATININE 0.73 05/03/2022   BUN 7 05/03/2022   CO2 22 05/03/2022   TSH 0.856 02/10/2022   HGBA1C 7.3 (H) 02/10/2022      Assessment & Plan:    Problem List Items Addressed This Visit       Other   Dysthymic disorder   Other Visit Diagnoses     Type 2 diabetes mellitus with hyperglycemia, without long-term current use of insulin ([place])    -  Primary     .  No orders of the defined types were placed in this encounter.   No orders of the defined types were placed in this encounter.    Follow-up: No follow-ups on file.  An After Visit Summary was printed and given to the patient.     I,Tong Pieczynski M Garron Eline,acting as a Education administrator for [employer], NP.,have documented all relevant documentation on the behalf of Rip Harbour, NP,as directed by  Rip Harbour, NP while in the presence of Rip Harbour, NP.    Rip Harbour, NP [place] [phone]

## 2022-06-01 NOTE — Progress Notes (Signed)
Established Patient Office Visit  Subjective   Patient ID: Chelsea Freeman, female    DOB: [birth year]  Age: 46 y.o. MRN: [id number]  Chief Complaint  Patient presents with   Depression with anxiety Type 2 Diabetes Mellitus    HPI Pt presents for follow-up of diabetes and depression medication. She was unable to tolerate Semaglutide for diabetes due to side effects. Prescribed Farxiga 10 mg QD. She was recently changed depression medications. Denies any side effects of medications.   Diabetes Mellitus Type II, Follow-up  Lab Results  Component Value Date   HGBA1C 7.3 (H) 02/10/2022   Wt Readings from Last 3 Encounters:  06/01/22 226 lb (102.5 kg)  05/03/22 229 lb (103.9 kg)  04/07/22 232 lb (105.2 kg)   Last seen for diabetes 3 months ago.  Management  includes Farxiga 10 mg and Metformin 1, 000 mg BID. She reports excellent compliance with treatment. She is not having side effects.  Home blood sugar records:  120s Episodes of hypoglycemia? No   Current insulin regiment: none Current exercise: none Current diet habits: in general, a "healthy" diet    Pertinent Labs: Lab Results  Component Value Date   CHOL 292 (H) 02/10/2022   HDL 55 02/10/2022   LDLCALC 209 (H) 02/10/2022   TRIG 152 (H) 02/10/2022   CHOLHDL 5.3 (H) 02/10/2022   Lab Results  Component Value Date   NA 140 05/03/2022   K 4.7 05/03/2022   CREATININE 0.73 05/03/2022   EGFR 103 05/03/2022   GFRNONAA 110 07/27/2020   GLUCOSE 163 (H) 05/03/2022     Depression, follow-up:  She  was last seen for this 4 weeks ago. Current treatment includes Pristiq 100 mg QD.  She reports excellent compliance with treatment. She is not having side effects    She reports excellent tolerance of treatment. She feels she is Improved since last visit.     06/01/2022    8:43 AM 05/03/2022    9:53 AM 04/07/2022    9:05 AM  Depression screen PHQ 2/9  Decreased Interest _0 Down, Depressed, Hopeless 0 3 3  PHQ -  2 Score _1 Altered sleeping 0 2 1  Tired, decreased energy _2 Change in appetite 0 0 3  Feeling bad or failure about yourself  0 0 2  Trouble concentrating 0 0 1  Moving slowly or fidgety/restless 0 0 0  Suicidal thoughts 0 0 0  PHQ-9 Score _3 Difficult doing work/chores Not difficult at all        GAD-7 Results    06/01/2022    8:44 AM 05/03/2022    9:54 AM 02/10/2022   11:17 AM  GAD-7 Generalized Anxiety Disorder Screening Tool  1. Feeling Nervous, Anxious, or on Edge 0 3 0  2. Not Being Able to Stop or Control Worrying 0 3 0  3. Worrying Too Much About Different Things 0 3 0  4. Trouble Relaxing 1 3 0  5. Being So Restless it's Hard To Sit Still 0 3 0  6. Becoming Easily Annoyed or Irritable 1 3 0  7. Feeling Afraid As If Something Awful Might Happen 0 0 0  Total GAD-7 Score 2 18 0  Difficulty At Work, Home, or Getting  Along With Others? Somewhat difficult Not difficult at all Not difficult at all      Review of Systems  All other systems reviewed and are negative.  Objective:   BP 138/84 (BP Location: Left Arm, Patient Position: Sitting)   Pulse 86   Temp 97.6 F (36.4 C) (Oral)   Ht _0  (1.575 m)   Wt 226 lb (102.5 kg)   LMP  (LMP Unknown)   SpO2 97%   BMI 41.34 kg/m    Physical Exam Vitals reviewed.  Constitutional:      Appearance: Normal appearance.  Skin:    General: Skin is warm and dry.     Capillary Refill: Capillary refill takes less than 2 seconds.  Neurological:     General: No focal deficit present.     Mental Status: She is alert and oriented to person, place, and time.  Psychiatric:        Mood and Affect: Mood normal.        Behavior: Behavior normal.      Assessment & Plan:   1. Type 2 diabetes mellitus with hyperglycemia, without long-term current use of insulin (HCC) - dapagliflozin propanediol (FARXIGA) 10 MG TABS tablet; Take 1 tablet (10 mg total) by mouth daily before breakfast.  Dispense: 90 tablet; Refill:  0  2. Dysthymic disorder - desvenlafaxine (PRISTIQ) 100 MG 24 hr tablet; Take 1 tablet (100 mg total) by mouth daily.  Dispense: 90 tablet; Refill: 3    Continue medications Consider Veozah for hot flashes Follow-up in 33-month, fasting   Follow-up: 395-monthfasting  I, ShRip HarbourNP, have reviewed all documentation for this visit. The documentation on 06/01/22 for the exam, diagnosis, procedures, and orders are all accurate and complete.   Signed, ShRip HarbourNP

## 2022-06-02 ENCOUNTER — Telehealth: Payer: Self-pay

## 2022-06-02 NOTE — Telephone Encounter (Signed)
PATIENT'S PRIOR AUTH WAS ATTEMPTED THROUGH HER INSURANCE AND HER INSURANCE KEEPS SAYING NOT VALID. PATIENT STATES THAT HER INSURANCE WILL CHANGE SEPTEMBER 1RST. PATIENT WAS OK WITH [place] JUST GIVING HER ENOUGH SAMPLES AND THEN WE WILL RE RUN THE PRIOR AUTHORIZATION WHEN HER NEW INSURANCE KICKS IN.

## 2022-06-28 ENCOUNTER — Encounter: Payer: Self-pay | Admitting: Nurse Practitioner

## 2022-07-05 ENCOUNTER — Encounter: Payer: Self-pay | Admitting: Nurse Practitioner

## 2022-07-05 NOTE — Telephone Encounter (Signed)
Appointment has been made for patient, spoke with patient on the phone.

## 2022-07-06 ENCOUNTER — Telehealth: Payer: Self-pay

## 2022-07-06 NOTE — Telephone Encounter (Signed)
Patient called requesting to change her appt to next week, however she was on schedule for elevated blood pressure, patient states she is no longer experiencing headache or dizziness. Requested to reschedule appt due to insurance becoming active 9/1. Rescheduled appt to 9/5 at 8:40 patient aware should her symptoms return or start to experience chest pain, SOB to go to Urgent Care or ED.

## 2022-07-07 ENCOUNTER — Ambulatory Visit: Payer: 59 | Admitting: Nurse Practitioner

## 2022-07-12 ENCOUNTER — Ambulatory Visit: Payer: BC Managed Care – PPO | Admitting: Nurse Practitioner

## 2022-07-12 ENCOUNTER — Other Ambulatory Visit: Payer: Self-pay | Admitting: Nurse Practitioner

## 2022-07-12 ENCOUNTER — Encounter: Payer: Self-pay | Admitting: Nurse Practitioner

## 2022-07-12 VITALS — BP 124/64 | HR 99 | Temp 96.5°F | Ht 63.0 in | Wt 226.4 lb

## 2022-07-12 DIAGNOSIS — E1165 Type 2 diabetes mellitus with hyperglycemia: Secondary | ICD-10-CM

## 2022-07-12 DIAGNOSIS — I152 Hypertension secondary to endocrine disorders: Secondary | ICD-10-CM

## 2022-07-12 DIAGNOSIS — E1159 Type 2 diabetes mellitus with other circulatory complications: Secondary | ICD-10-CM

## 2022-07-12 MED ORDER — LISINOPRIL 5 MG PO TABS: 5.0000 mg | ORAL_TABLET | Freq: Every day | ORAL | 3 refills | Status: DC

## 2022-07-12 MED ORDER — DAPAGLIFLOZIN PROPANEDIOL 10 MG PO TABS: 10.0000 mg | ORAL_TABLET | Freq: Every day | ORAL | 1 refills | Status: DC

## 2022-07-12 NOTE — Progress Notes (Signed)
Acute Office Visit  Subjective:    Patient ID: Chelsea Freeman, female    DOB: [birth year], 46 y.o.   MRN: [id number]  Chief Complaint  Patient presents with   Hypertension    HPI:  Patient reports having had elevated blood pressures x 1 week ago states she noticed it due to feeling dizzy, headache and her cheeks were flush. Outside blood pressures are 187/87, 186/32, 161/90, 144/81,161/81, 1680, 170/79, 165/74, 160/77, 178/80, 148/78, 186/83, 183/89. She denies chest pain or dyspnea. Denies previous diagnosis of hypertension. States she had a cardiac work-up with Dr Harriet Masson in 2021 for chest pain and fatigue which was negative.  Recently prescribed Pristiq for depression.   Pertinent labs: Lab Results  Component Value Date   CHOL 292 (H) 02/10/2022   HDL 55 02/10/2022   LDLCALC 209 (H) 02/10/2022   TRIG 152 (H) 02/10/2022   CHOLHDL 5.3 (H) 02/10/2022   Lab Results  Component Value Date   NA 140 05/03/2022   K 4.7 05/03/2022   CREATININE 0.73 05/03/2022   EGFR 103 05/03/2022   GFRNONAA 110 07/27/2020   GLUCOSE 163 (H) 05/03/2022     The 10-year ASCVD risk score (Arnett DK, et al., 2019) is: 3.4%    Past Medical History:  Diagnosis Date   Abnormal uterine bleeding (AUB) 06/28/2016   Anxiety disorder    Cellulitis    Dysmenorrhea 06/28/2016   Dysthymic disorder    Fatigue    Hyperlipidemia    Hyperprolactinemia (HCC)    Hypoxemia    Increased frequency of urination 06/28/2016   Intramural leiomyoma of uterus 06/28/2016   Obesity    Prediabetes    Sebaceous cyst    Sleep apnea    Type 2 diabetes mellitus without complications ([place])    Urge incontinence 06/28/2016    Past Surgical History:  Procedure Laterality Date   ABDOMINAL HYSTERECTOMY      Family History  Problem Relation Age of Onset   Hypertension Mother    Depression Mother    Atrial fibrillation Mother    Hypertension Father    Diabetes Father    Heart Problems Father    Parkinson's disease  Father    Atrial fibrillation Sister    Thyroid disease Sister    Cancer Sister        Breast-remission   Heart Problems Sister    Breast cancer Sister    Bipolar disorder Sister    Kidney failure Sister    Cancer Maternal Grandfather        Leukemia   Heart Problems Paternal Grandmother    Heart failure Paternal Grandmother    Stroke Paternal Grandfather    Heart Problems Paternal Grandfather     Social History   Socioeconomic History   Marital status: Married    Spouse name: Marne Meline   Number of children: 2   Years of education: Not on file   Highest education level: Not on file  Occupational History   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  Tobacco Use   Smoking status: Never   Smokeless tobacco: Never  Substance and Sexual Activity   Alcohol use: Not Currently   Drug use: Not Currently   Sexual activity: Yes    Partners: Male  Other Topics Concern   Not on file  Social History Narrative   ** Merged History Encounter **       Social Determinants of Health   Financial Resource Strain: Low Risk  (02/10/2022)   Overall Emergency planning/management officer  Strain (CARDIA)    Difficulty of Paying Living Expenses: Not hard at all  Food Insecurity: No Food Insecurity (02/10/2022)   Hunger Vital Sign    Worried About Running Out of Food in the Last Year: Never true    Ran Out of Food in the Last Year: Never true  Transportation Needs: No Transportation Needs (02/10/2022)   PRAPARE - Hydrologist (Medical): No    Lack of Transportation (Non-Medical): No  Physical Activity: Inactive (04/07/2022)   Exercise Vital Sign    Days of Exercise per Week: 0 days    Minutes of Exercise per Session: 0 min  Stress: No Stress Concern Present (02/10/2022)   [place]    Feeling of Stress : Not at all  Social Connections: Moderately Isolated (02/10/2022)   Social Connection and Isolation Panel [NHANES]    Frequency of  Communication with Friends and Family: More than three times a week    Frequency of Social Gatherings with Friends and Family: More than three times a week    Attends Religious Services: Never    Marine scientist or Organizations: No    Attends Archivist Meetings: Never    Marital Status: Married  Human resources officer Violence: Not At Risk (02/10/2022)   Humiliation, Afraid, Rape, and Kick questionnaire    Fear of Current or Ex-Partner: No    Emotionally Abused: No    Physically Abused: No    Sexually Abused: No    Outpatient Medications Prior to Visit  Medication Sig Dispense Refill   ALPRAZolam (XANAX) 0.5 MG tablet Take 1 tablet (0.5 mg total) by mouth 2 (two) times daily as needed for anxiety. 20 tablet 0   dapagliflozin propanediol (FARXIGA) 10 MG TABS tablet Take 1 tablet (10 mg total) by mouth daily before breakfast. 90 tablet 0   desvenlafaxine (PRISTIQ) 100 MG 24 hr tablet Take 1 tablet (100 mg total) by mouth daily. 90 tablet 3   metFORMIN (GLUCOPHAGE) 1000 MG tablet Take 1 tablet (1,000 mg total) by mouth 2 (two) times daily with a meal. 180 tablet 3   ondansetron (ZOFRAN) 4 MG tablet Take 1 tablet (4 mg total) by mouth every 8 (eight) hours as needed for nausea or vomiting. 20 tablet 0   Vitamin D, Ergocalciferol, (DRISDOL) 1.25 MG (50000 UNIT) CAPS capsule Take 1 capsule (50,000 Units total) by mouth every 7 (seven) days. 5 capsule 3   No facility-administered medications prior to visit.    Allergies  Allergen Reactions   Crestor [Rosuvastatin]     Insomnia and indigestion   Zocor [Simvastatin]     Insomnia and myalgia and indigestion    Review of Systems See pertinent positives and negatives per HPI.     Objective:    Physical Exam Vitals reviewed.  Constitutional:      Appearance: Normal appearance.  HENT:     Right Ear: Tympanic membrane normal.     Left Ear: Tympanic membrane normal.     Nose: Nose normal.     Mouth/Throat:     Mouth: Mucous  membranes are moist.  Cardiovascular:     Rate and Rhythm: Normal rate and regular rhythm.     Pulses: Normal pulses.     Heart sounds: Normal heart sounds.  Pulmonary:     Effort: Pulmonary effort is normal.     Breath sounds: Normal breath sounds.  Abdominal:     General: Bowel  sounds are normal.     Palpations: Abdomen is soft.  Skin:    General: Skin is warm and dry.     Capillary Refill: Capillary refill takes less than 2 seconds.  Neurological:     General: No focal deficit present.     Mental Status: She is alert and oriented to person, place, and time.  Psychiatric:        Mood and Affect: Mood normal.        Behavior: Behavior normal.     BP 124/64   Pulse 99   Temp (!) 96.5 F (35.8 C)   Ht _0  (1.6 m)   Wt 226 lb 6.4 oz (102.7 kg)   LMP  (LMP Unknown)   SpO2 99%   BMI 40.10 kg/m   Wt Readings from Last 3 Encounters:  07/12/22 226 lb 6.4 oz (102.7 kg)  06/01/22 226 lb (102.5 kg)  05/03/22 229 lb (103.9 kg)    Health Maintenance Due  Topic Date Due   FOOT EXAM  Never done   OPHTHALMOLOGY EXAM  Never done   TETANUS/TDAP  Never done   INFLUENZA VACCINE  Never done       Lab Results  Component Value Date   TSH 0.856 02/10/2022   Lab Results  Component Value Date   WBC 6.4 02/10/2022   HGB 14.5 02/10/2022   HCT 41.7 02/10/2022   MCV 88 02/10/2022   PLT 253 02/10/2022   Lab Results  Component Value Date   NA 140 05/03/2022   K 4.7 05/03/2022   CO2 22 05/03/2022   GLUCOSE 163 (H) 05/03/2022   BUN 7 05/03/2022   CREATININE 0.73 05/03/2022   BILITOT 0.3 05/03/2022   ALKPHOS 75 05/03/2022   AST 28 05/03/2022   ALT 57 (H) 05/03/2022   PROT 7.2 05/03/2022   ALBUMIN 4.5 05/03/2022   CALCIUM 9.8 05/03/2022   EGFR 103 05/03/2022   Lab Results  Component Value Date   CHOL 292 (H) 02/10/2022   Lab Results  Component Value Date   HDL 55 02/10/2022   Lab Results  Component Value Date   LDLCALC 209 (H) 02/10/2022   Lab Results   Component Value Date   TRIG 152 (H) 02/10/2022   Lab Results  Component Value Date   CHOLHDL 5.3 (H) 02/10/2022   Lab Results  Component Value Date   HGBA1C 7.3 (H) 02/10/2022       Assessment & Plan:   1. Hypertension associated with diabetes ([place]) - lisinopril (ZESTRIL) 5 MG tablet; Take 1 tablet (5 mg total) by mouth daily.  Dispense: 90 tablet; Refill: 3    Begin Lisinopril 5 mg daily Low salt- DASH diet Increase physical activity Monitor BP, keep log Follow-up in 4 weeks   Follow-up: 4-weeks  An After Visit Summary was printed and given to the patient.  I, Rip Harbour, NP, have reviewed all documentation for this visit. The documentation on 07/12/22 for the exam, diagnosis, procedures, and orders are all accurate and complete.   Signed, Rip Harbour, NP [place] [phone]

## 2022-07-12 NOTE — Patient Instructions (Signed)
Begin Lisinopril 5 mg daily Low salt- DASH diet Increase physical activity Monitor BP, keep log Follow-up in 4 weeks  Managing Your Hypertension Hypertension, also called high blood pressure, is when the force of the blood pressing against the walls of the arteries is too strong. Arteries are blood vessels that carry blood from your heart throughout your body. Hypertension forces the heart to work harder to pump blood and may cause the arteries to become narrow or stiff. Understanding blood pressure readings A blood pressure reading includes a higher number over a lower number: The first, or top, number is called the systolic pressure. It is a measure of the pressure in your arteries as your heart beats. The second, or bottom number, is called the diastolic pressure. It is a measure of the pressure in your arteries as the heart relaxes. For most people, a normal blood pressure is below 120/80. Your personal target blood pressure may vary depending on your medical conditions, your age, and other factors. Blood pressure is classified into four stages. Based on your blood pressure reading, your health care provider may use the following stages to determine what type of treatment you need, if any. Systolic pressure and diastolic pressure are measured in a unit called millimeters of mercury (mmHg). Normal Systolic pressure: below 169. Diastolic pressure: below 80. Elevated Systolic pressure: 678-938. Diastolic pressure: below 80. Hypertension stage 1 Systolic pressure: 101-751. Diastolic pressure: 02-58. Hypertension stage 2 Systolic pressure: 527 or above. Diastolic pressure: 90 or above. How can this condition affect me? Managing your hypertension is very important. Over time, hypertension can damage the arteries and decrease blood flow to parts of the body, including the brain, heart, and kidneys. Having untreated or uncontrolled hypertension can lead to: A heart attack. A stroke. A  weakened blood vessel (aneurysm). Heart failure. Kidney damage. Eye damage. Memory and concentration problems. Vascular dementia. What actions can I take to manage this condition? Hypertension can be managed by making lifestyle changes and possibly by taking medicines. Your health care provider will help you make a plan to bring your blood pressure within a normal range. You may be referred for counseling on a healthy diet and physical activity. Nutrition  Eat a diet that is high in fiber and potassium, and low in salt (sodium), added sugar, and fat. An example eating plan is called the DASH diet. DASH stands for Dietary Approaches to Stop Hypertension. To eat this way: Eat plenty of fresh fruits and vegetables. Try to fill one-half of your plate at each meal with fruits and vegetables. Eat whole grains, such as whole-wheat pasta, brown rice, or whole-grain bread. Fill about one-fourth of your plate with whole grains. Eat low-fat dairy products. Avoid fatty cuts of meat, processed or cured meats, and poultry with skin. Fill about one-fourth of your plate with lean proteins such as fish, chicken without skin, beans, eggs, and tofu. Avoid pre-made and processed foods. These tend to be higher in sodium, added sugar, and fat. Reduce your daily sodium intake. Many people with hypertension should eat less than 1,500 mg of sodium a day. Lifestyle  Work with your health care provider to maintain a healthy body weight or to lose weight. Ask what an ideal weight is for you. Get at least 30 minutes of exercise that causes your heart to beat faster (aerobic exercise) most days of the week. Activities may include walking, swimming, or biking. Include exercise to strengthen your muscles (resistance exercise), such as weight lifting, as part of  your weekly exercise routine. Try to do these types of exercises for 30 minutes at least 3 days a week. Do not use any products that contain nicotine or tobacco. These  products include cigarettes, chewing tobacco, and vaping devices, such as e-cigarettes. If you need help quitting, ask your health care provider. Control any long-term (chronic) conditions you have, such as high cholesterol or diabetes. Identify your sources of stress and find ways to manage stress. This may include meditation, deep breathing, or making time for fun activities. Alcohol use Do not drink alcohol if: Your health care provider tells you not to drink. You are pregnant, may be pregnant, or are planning to become pregnant. If you drink alcohol: Limit how much you have to: 0-1 drink a day for women. 0-2 drinks a day for men. Know how much alcohol is in your drink. In the [place] one drink equals one 12 oz bottle of beer (355 mL), one 5 oz glass of wine (148 mL), or one 1 oz glass of hard liquor (44 mL). Medicines Your health care provider may prescribe medicine if lifestyle changes are not enough to get your blood pressure under control and if: Your systolic blood pressure is 130 or higher. Your diastolic blood pressure is 80 or higher. Take medicines only as told by your health care provider. Follow the directions carefully. Blood pressure medicines must be taken as told by your health care provider. The medicine does not work as well when you skip doses. Skipping doses also puts you at risk for problems. Monitoring Before you monitor your blood pressure: Do not smoke, drink caffeinated beverages, or exercise within 30 minutes before taking a measurement. Use the bathroom and empty your bladder (urinate). Sit quietly for at least 5 minutes before taking measurements. Monitor your blood pressure at home as told by your health care provider. To do this: Sit with your back straight and supported. Place your feet flat on the floor. Do not cross your legs. Support your arm on a flat surface, such as a table. Make sure your upper arm is at heart level. Each time you measure, take two or  three readings one minute apart and record the results. You may also need to have your blood pressure checked regularly by your health care provider. General information Talk with your health care provider about your diet, exercise habits, and other lifestyle factors that may be contributing to hypertension. Review all the medicines you take with your health care provider because there may be side effects or interactions. Keep all follow-up visits. Your health care provider can help you create and adjust your plan for managing your high blood pressure. Where to find more information National Heart, Lung, and Blood Institute: https://wilson-eaton.com/ American Heart Association: www.heart.org Contact a health care provider if: You think you are having a reaction to medicines you have taken. You have repeated (recurrent) headaches. You feel dizzy. You have swelling in your ankles. You have trouble with your vision. Get help right away if: You develop a severe headache or confusion. You have unusual weakness or numbness, or you feel faint. You have severe pain in your chest or abdomen. You vomit repeatedly. You have trouble breathing. These symptoms may be an emergency. Get help right away. Call 911. Do not wait to see if the symptoms will go away. Do not drive yourself to the hospital. Summary Hypertension is when the force of blood pumping through your arteries is too strong. If this condition is not controlled,  it may put you at risk for serious complications. Your personal target blood pressure may vary depending on your medical conditions, your age, and other factors. For most people, a normal blood pressure is less than 120/80. Hypertension is managed by lifestyle changes, medicines, or both. Lifestyle changes to help manage hypertension include losing weight, eating a healthy, low-sodium diet, exercising more, stopping smoking, and limiting alcohol. This information is not intended to  replace advice given to you by your health care provider. Make sure you discuss any questions you have with your health care provider. Document Revised: 07/08/2021 Document Reviewed: 07/08/2021 Elsevier Patient Education  [place] Eating Plan DASH stands for Dietary Approaches to Stop Hypertension. The DASH eating plan is a healthy eating plan that has been shown to: Reduce high blood pressure (hypertension). Reduce your risk for type 2 diabetes, heart disease, and stroke. Help with weight loss. What are tips for following this plan? Reading food labels Check food labels for the amount of salt (sodium) per serving. Choose foods with less than 5 percent of the Daily Value of sodium. Generally, foods with less than 300 milligrams (mg) of sodium per serving fit into this eating plan. To find whole grains, look for the word "whole" as the first word in the ingredient list. Shopping Buy products labeled as "low-sodium" or "no salt added." Buy fresh foods. Avoid canned foods and pre-made or frozen meals. Cooking Avoid adding salt when cooking. Use salt-free seasonings or herbs instead of table salt or sea salt. Check with your health care provider or pharmacist before using salt substitutes. Do not fry foods. Cook foods using healthy methods such as baking, boiling, grilling, roasting, and broiling instead. Cook with heart-healthy oils, such as olive, canola, avocado, soybean, or sunflower oil. Meal planning  Eat a balanced diet that includes: 4 or more servings of fruits and 4 or more servings of vegetables each day. Try to fill one-half of your plate with fruits and vegetables. 6-8 servings of whole grains each day. Less than 6 oz (170 g) of lean meat, poultry, or fish each day. A 3-oz (85-g) serving of meat is about the same size as a deck of cards. One egg equals 1 oz (28 g). 2-3 servings of low-fat dairy each day. One serving is 1 cup (237 mL). 1 serving of nuts, seeds, or  beans 5 times each week. 2-3 servings of heart-healthy fats. Healthy fats called omega-3 fatty acids are found in foods such as walnuts, flaxseeds, fortified milks, and eggs. These fats are also found in cold-water fish, such as sardines, salmon, and mackerel. Limit how much you eat of: Canned or prepackaged foods. Food that is high in trans fat, such as some fried foods. Food that is high in saturated fat, such as fatty meat. Desserts and other sweets, sugary drinks, and other foods with added sugar. Full-fat dairy products. Do not salt foods before eating. Do not eat more than 4 egg yolks a week. Try to eat at least 2 vegetarian meals a week. Eat more home-cooked food and less restaurant, buffet, and fast food. Lifestyle When eating at a restaurant, ask that your food be prepared with less salt or no salt, if possible. If you drink alcohol: Limit how much you use to: 0-1 drink a day for women who are not pregnant. 0-2 drinks a day for men. Be aware of how much alcohol is in your drink. In the [place] one drink equals one 12 oz bottle of beer (  355 mL), one 5 oz glass of wine (148 mL), or one 1 oz glass of hard liquor (44 mL). General information Avoid eating more than 2,300 mg of salt a day. If you have hypertension, you may need to reduce your sodium intake to 1,500 mg a day. Work with your health care provider to maintain a healthy body weight or to lose weight. Ask what an ideal weight is for you. Get at least 30 minutes of exercise that causes your heart to beat faster (aerobic exercise) most days of the week. Activities may include walking, swimming, or biking. Work with your health care provider or dietitian to adjust your eating plan to your individual calorie needs. What foods should I eat? Fruits All fresh, dried, or frozen fruit. Canned fruit in natural juice (without added sugar). Vegetables Fresh or frozen vegetables (raw, steamed, roasted, or grilled). Low-sodium or  reduced-sodium tomato and vegetable juice. Low-sodium or reduced-sodium tomato sauce and tomato paste. Low-sodium or reduced-sodium canned vegetables. Grains Whole-grain or whole-wheat bread. Whole-grain or whole-wheat pasta. Brown rice. [place]. Bulgur. Whole-grain and low-sodium cereals. Pita bread. Low-fat, low-sodium crackers. Whole-wheat flour tortillas. Meats and other proteins Skinless chicken or [place]. Ground chicken or [place]. Pork with fat trimmed off. Fish and seafood. Egg whites. Dried beans, peas, or lentils. Unsalted nuts, nut butters, and seeds. Unsalted canned beans. Lean cuts of beef with fat trimmed off. Low-sodium, lean precooked or cured meat, such as sausages or meat loaves. Dairy Low-fat (1%) or fat-free (skim) milk. Reduced-fat, low-fat, or fat-free cheeses. Nonfat, low-sodium ricotta or cottage cheese. Low-fat or nonfat yogurt. Low-fat, low-sodium cheese. Fats and oils Soft margarine without trans fats. Vegetable oil. Reduced-fat, low-fat, or light mayonnaise and salad dressings (reduced-sodium). Canola, safflower, olive, avocado, soybean, and sunflower oils. Avocado. Seasonings and condiments Herbs. Spices. Seasoning mixes without salt. Other foods Unsalted popcorn and pretzels. Fat-free sweets. The items listed above may not be a complete list of foods and beverages you can eat. Contact a dietitian for more information. What foods should I avoid? Fruits Canned fruit in a light or heavy syrup. Fried fruit. Fruit in cream or butter sauce. Vegetables Creamed or fried vegetables. Vegetables in a cheese sauce. Regular canned vegetables (not low-sodium or reduced-sodium). Regular canned tomato sauce and paste (not low-sodium or reduced-sodium). Regular tomato and vegetable juice (not low-sodium or reduced-sodium). Angie Fava. Olives. Grains Baked goods made with fat, such as croissants, muffins, or some breads. Dry pasta or rice meal packs. Meats and other  proteins Fatty cuts of meat. Ribs. Fried meat. Berniece Salines. Bologna, salami, and other precooked or cured meats, such as sausages or meat loaves. Fat from the back of a pig (fatback). Bratwurst. Salted nuts and seeds. Canned beans with added salt. Canned or smoked fish. Whole eggs or egg yolks. Chicken or [place] with skin. Dairy Whole or 2% milk, cream, and half-and-half. Whole or full-fat cream cheese. Whole-fat or sweetened yogurt. Full-fat cheese. Nondairy creamers. Whipped toppings. Processed cheese and cheese spreads. Fats and oils Butter. Stick margarine. Lard. Shortening. Ghee. Bacon fat. Tropical oils, such as coconut, palm kernel, or palm oil. Seasonings and condiments Onion salt, garlic salt, seasoned salt, table salt, and sea salt. Worcestershire sauce. Tartar sauce. Barbecue sauce. Teriyaki sauce. Soy sauce, including reduced-sodium. Steak sauce. Canned and packaged gravies. Fish sauce. Oyster sauce. Cocktail sauce. Store-bought horseradish. Ketchup. Mustard. Meat flavorings and tenderizers. Bouillon cubes. Hot sauces. Pre-made or packaged marinades. Pre-made or packaged taco seasonings. Relishes. Regular salad dressings. Other foods Salted popcorn and  pretzels. The items listed above may not be a complete list of foods and beverages you should avoid. Contact a dietitian for more information. Where to find more information National Heart, Lung, and Blood Institute: https://wilson-eaton.com/ American Heart Association: www.heart.org Academy of Nutrition and Dietetics: www.eatright.[place]: www.kidney.org Summary The DASH eating plan is a healthy eating plan that has been shown to reduce high blood pressure (hypertension). It may also reduce your risk for type 2 diabetes, heart disease, and stroke. When on the DASH eating plan, aim to eat more fresh fruits and vegetables, whole grains, lean proteins, low-fat dairy, and heart-healthy fats. With the DASH eating plan, you should  limit salt (sodium) intake to 2,300 mg a day. If you have hypertension, you may need to reduce your sodium intake to 1,500 mg a day. Work with your health care provider or dietitian to adjust your eating plan to your individual calorie needs. This information is not intended to replace advice given to you by your health care provider. Make sure you discuss any questions you have with your health care provider. Document Revised: 09/27/2019 Document Reviewed: 09/27/2019 Elsevier Patient Education  [place].

## 2022-07-25 DIAGNOSIS — M79645 Pain in left finger(s): Secondary | ICD-10-CM | POA: Diagnosis not present

## 2022-07-25 DIAGNOSIS — M79641 Pain in right hand: Secondary | ICD-10-CM | POA: Diagnosis not present

## 2022-08-02 ENCOUNTER — Telehealth: Payer: Self-pay

## 2022-08-02 DIAGNOSIS — I152 Hypertension secondary to endocrine disorders: Secondary | ICD-10-CM

## 2022-08-02 MED ORDER — LISINOPRIL 5 MG PO TABS: 10.0000 mg | ORAL_TABLET | Freq: Every day | ORAL | 3 refills | Status: DC

## 2022-08-02 NOTE — Telephone Encounter (Signed)
Chelsea Freeman called with bp concerns.  She was started on lisinopril 5 mg daily on 07/12/2022.  She had been doing well until she became sick over the last 48 hours and had a positive home covid test yesterday.  She is having no respiratory issues (no cough, shortness or breath and no chest pain).  Her bp this am is 176/73, pulse 106.  Symptoms reviewed with Jerrell Belfast, NP and she advised that she increase her lisinopril to 10 mg daily and keep her follow-up appointment as scheduled.

## 2022-08-05 ENCOUNTER — Telehealth: Payer: Self-pay

## 2022-08-05 ENCOUNTER — Other Ambulatory Visit: Payer: Self-pay | Admitting: Nurse Practitioner

## 2022-08-05 MED ORDER — MAGIC MOUTHWASH W/LIDOCAINE: 5.0000 mL | Freq: Three times a day (TID) | ORAL | 1 refills | Status: DC | PRN

## 2022-08-05 NOTE — Telephone Encounter (Signed)
Chelsea Freeman called to report that she is feeling better with her covid symptoms but she has now developed an ulcer on her uvula.  Symptoms reviewed with Jerrell Belfast, NP and she advised magic mouthwash.  Order faxed to [facility].

## 2022-08-08 DIAGNOSIS — G4733 Obstructive sleep apnea (adult) (pediatric): Secondary | ICD-10-CM | POA: Diagnosis not present

## 2022-08-10 ENCOUNTER — Ambulatory Visit: Payer: BC Managed Care – PPO | Admitting: Nurse Practitioner

## 2022-08-10 ENCOUNTER — Encounter: Payer: Self-pay | Admitting: Nurse Practitioner

## 2022-08-10 VITALS — BP 130/80 | HR 90 | Temp 95.8°F | Ht 62.5 in | Wt 226.0 lb

## 2022-08-10 DIAGNOSIS — K219 Gastro-esophageal reflux disease without esophagitis: Secondary | ICD-10-CM | POA: Diagnosis not present

## 2022-08-10 DIAGNOSIS — E1165 Type 2 diabetes mellitus with hyperglycemia: Secondary | ICD-10-CM | POA: Diagnosis not present

## 2022-08-10 DIAGNOSIS — E1159 Type 2 diabetes mellitus with other circulatory complications: Secondary | ICD-10-CM | POA: Diagnosis not present

## 2022-08-10 DIAGNOSIS — Z6841 Body Mass Index (BMI) 40.0 and over, adult: Secondary | ICD-10-CM

## 2022-08-10 DIAGNOSIS — Z23 Encounter for immunization: Secondary | ICD-10-CM

## 2022-08-10 DIAGNOSIS — I152 Hypertension secondary to endocrine disorders: Secondary | ICD-10-CM

## 2022-08-10 MED ORDER — TIRZEPATIDE 2.5 MG/0.5ML ~~LOC~~ SOAJ: 2.5000 mg | SUBCUTANEOUS | 0 refills | Status: DC

## 2022-08-10 MED ORDER — PANTOPRAZOLE SODIUM 40 MG PO TBEC: 40.0000 mg | DELAYED_RELEASE_TABLET | Freq: Every day | ORAL | 3 refills | Status: DC

## 2022-08-10 MED ORDER — LISINOPRIL 10 MG PO TABS: 10.0000 mg | ORAL_TABLET | Freq: Every day | ORAL | 3 refills | Status: DC

## 2022-08-10 NOTE — Progress Notes (Signed)
Subjective:  Patient ID: Chelsea Freeman, female    DOB: July 17, 1976  Age: 46 y.o. MRN: [id number]  Chief Complaint  Patient presents with   Diabetes   Hypertension   HPI: Pt presents for follow-up of Type 2 DM, Hypertension, and hyperlipidemia. States she has COVID-19 a few weeks ago. Reports she was quite ill for 8 days. Her BP was elevated, increase Lisinopril to 10 mg QD one-week ago.   Diabetes Mellitus Type II, follow-up  Lab Results  Component Value Date   HGBA1C 7.3 (H) 02/10/2022   Last seen for diabetes 3 months ago.  Management since then includes Metformin 1, 000 mg BID; pt prescribed Farxiga; Unable to tolerate Semaglutide due to GI side effects She reports good compliance with treatment. She is not having side effects.  Home blood sugar records: are not being checked Episodes of hypoglycemia? No   Current insulin regiment: NONE Most Recent Eye Exam: 1 YEAR AGO  --------------------------------------------------------------------------------------------------- Hypertension, follow-up  BP Readings from Last 3 Encounters:  08/10/22 130/80  07/12/22 124/64  06/01/22 138/84   Wt Readings from Last 3 Encounters:  08/10/22 226 lb (102.5 kg)  07/12/22 226 lb 6.4 oz (102.7 kg)  06/01/22 226 lb (102.5 kg)     She was last seen for hypertension 3 months ago.  BP at that visit was 138/84. Management since that visit includes Lisinopril 10 mg QD (increased from 5 mg one week ago). She reports excellent compliance with treatment. She is not having side effects.  She is not exercising. She is not adherent to low salt diet.   Outside blood pressures are 159/79, 126/65, 156/65, 152/64, 138/78, 151/65, 173/70, 153/76, 176/74, 152/72, 176/72, 146/74, 165/68, 162/67, 160/73  She does not smoke.  Use of agents associated with hypertension: NSAIDS.   --------------------------------------------------------------------------------------------------- Lipid/Cholesterol,  follow-up  Last Lipid Panel: Lab Results  Component Value Date   CHOL 292 (H) 02/10/2022   LDLCALC 209 (H) 02/10/2022   HDL 55 02/10/2022   TRIG 152 (H) 02/10/2022    She was last seen for this 3 months ago.  Management since that visit includes diet modification. Pt was previously prescribed Crestor, d/c due to elevated liver enzymes. Will resume statin therapy pending lab results She reports good compliance with treatment.  Last metabolic panel Lab Results  Component Value Date   GLUCOSE 163 (H) 05/03/2022   NA 140 05/03/2022   K 4.7 05/03/2022   BUN 7 05/03/2022   CREATININE 0.73 05/03/2022   GFRNONAA 110 07/27/2020   GFRAA 127 07/27/2020   CALCIUM 9.8 05/03/2022   AST 28 05/03/2022   ALT 57 (H) 05/03/2022   The 10-year ASCVD risk score (Arnett DK, et al., 2019) is: 3%   Depression, Follow-up  She  was last seen for this 3 months ago. Current treatment includes Pristiq 100 mg QD  She reports excellent compliance with treatment. She is not having side effects.  She reports excellent tolerance of treatment. Current symptoms include: fatigue She feels she is Improved since last visit.     08/10/2022    8:34 AM 06/01/2022    8:43 AM 05/03/2022    9:53 AM  Depression screen PHQ 2/9  Decreased Interest 0 1 3  Down, Depressed, Hopeless 0 0 3  PHQ - 2 Score 0 1 6  Altered sleeping 0 0 2  Tired, decreased energy '1 1 3  '$ Change in appetite 0 0 0  Feeling bad or failure about yourself  0 0 0  Trouble concentrating 0 0 0  Moving slowly or fidgety/restless 0 0 0  Suicidal thoughts 0 0 0  PHQ-9 Score '1 2 11  '$ Difficult doing work/chores Not difficult at all Not difficult at all      Current Outpatient Medications on File Prior to Visit  Medication Sig Dispense Refill   ALPRAZolam (XANAX) 0.5 MG tablet Take 1 tablet (0.5 mg total) by mouth 2 (two) times daily as needed for anxiety. 20 tablet 0   dapagliflozin propanediol (FARXIGA) 10 MG TABS tablet Take 1 tablet (10 mg  total) by mouth daily before breakfast. 90 tablet 1   desvenlafaxine (PRISTIQ) 100 MG 24 hr tablet Take 1 tablet (100 mg total) by mouth daily. 90 tablet 3   lisinopril (ZESTRIL) 5 MG tablet Take 2 tablets (10 mg total) by mouth daily. 90 tablet 3   magic mouthwash w/lidocaine SOLN Take 5 mLs by mouth 3 (three) times daily as needed for mouth pain. 120 mL 1   metFORMIN (GLUCOPHAGE) 1000 MG tablet Take 1 tablet (1,000 mg total) by mouth 2 (two) times daily with a meal. 180 tablet 3   ondansetron (ZOFRAN) 4 MG tablet Take 1 tablet (4 mg total) by mouth every 8 (eight) hours as needed for nausea or vomiting. 20 tablet 0   Vitamin D, Ergocalciferol, (DRISDOL) 1.25 MG (50000 UNIT) CAPS capsule Take 1 capsule (50,000 Units total) by mouth every 7 (seven) days. 5 capsule 3   No current facility-administered medications on file prior to visit.   Past Medical History:  Diagnosis Date   Abnormal uterine bleeding (AUB) 06/28/2016   Anxiety disorder    Cellulitis    Dysmenorrhea 06/28/2016   Dysthymic disorder    Fatigue    Hyperlipidemia    Hyperprolactinemia (HCC)    Hypoxemia    Increased frequency of urination 06/28/2016   Intramural leiomyoma of uterus 06/28/2016   Obesity    Prediabetes    Sebaceous cyst    Sleep apnea    Type 2 diabetes mellitus without complications ([place])    Urge incontinence 06/28/2016   Past Surgical History:  Procedure Laterality Date   ABDOMINAL HYSTERECTOMY      Family History  Problem Relation Age of Onset   Hypertension Mother    Depression Mother    Atrial fibrillation Mother    Hypertension Father    Diabetes Father    Heart Problems Father    Parkinson's disease Father    Atrial fibrillation Sister    Thyroid disease Sister    Cancer Sister        Breast-remission   Heart Problems Sister    Breast cancer Sister    Bipolar disorder Sister    Kidney failure Sister    Cancer Maternal Grandfather        Leukemia   Heart Problems Paternal Grandmother     Heart failure Paternal Grandmother    Stroke Paternal Grandfather    Heart Problems Paternal Grandfather    Social History   Socioeconomic History   Marital status: Married    Spouse name: Svea Pusch   Number of children: 2   Years of education: Not on file   Highest education level: Not on file  Occupational History   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  Tobacco Use   Smoking status: Never   Smokeless tobacco: Never  Substance and Sexual Activity   Alcohol use: Not Currently   Drug use: Not Currently   Sexual activity: Yes    Partners: Male  Other Topics Concern   Not on file  Social History Narrative   ** Merged History Encounter **       Social Determinants of Health   Financial Resource Strain: Low Risk  (02/10/2022)   Overall Financial Resource Strain (CARDIA)    Difficulty of Paying Living Expenses: Not hard at all  Food Insecurity: No Food Insecurity (02/10/2022)   Hunger Vital Sign    Worried About Running Out of Food in the Last Year: Never true    Ran Out of Food in the Last Year: Never true  Transportation Needs: No Transportation Needs (02/10/2022)   PRAPARE - Hydrologist (Medical): No    Lack of Transportation (Non-Medical): No  Physical Activity: Inactive (04/07/2022)   Exercise Vital Sign    Days of Exercise per Week: 0 days    Minutes of Exercise per Session: 0 min  Stress: No Stress Concern Present (02/10/2022)   [place]    Feeling of Stress : Not at all  Social Connections: Moderately Isolated (02/10/2022)   Social Connection and Isolation Panel [NHANES]    Frequency of Communication with Friends and Family: More than three times a week    Frequency of Social Gatherings with Friends and Family: More than three times a week    Attends Religious Services: Never    Marine scientist or Organizations: No    Attends Archivist Meetings: Never    Marital  Status: Married    Review of Systems  Constitutional:  Negative for chills, fatigue and fever.  HENT:  Negative for congestion, ear pain, rhinorrhea and sore throat.   Respiratory:  Negative for cough and shortness of breath.   Cardiovascular:  Negative for chest pain.  Gastrointestinal:  Negative for abdominal pain, constipation, diarrhea, nausea and vomiting.  Genitourinary:  Negative for dysuria and urgency.  Musculoskeletal:  Positive for arthralgias. Negative for back pain and myalgias.  Neurological:  Negative for dizziness, weakness, light-headedness and headaches.  Psychiatric/Behavioral:  Negative for dysphoric mood. The patient is not nervous/anxious.      Objective:  BP 130/80   Pulse 90   Temp (!) 95.8 F (35.4 C)   Ht 5' 2.5" (1.588 m)   Wt 226 lb (102.5 kg)   LMP  (LMP Unknown)   SpO2 100%   BMI 40.68 kg/m      08/10/2022    8:25 AM 07/12/2022    9:01 AM 06/01/2022    8:45 AM  BP/Weight  Systolic BP 768 115 726  Diastolic BP 80 64 84  Wt. (Lbs) 226 226.4 226  BMI 40.68 kg/m2 40.1 kg/m2 41.34 kg/m2    Physical Exam Vitals reviewed.  Constitutional:      Appearance: Normal appearance.  HENT:     Head: Normocephalic.     Right Ear: Tympanic membrane normal.     Left Ear: Tympanic membrane normal.     Nose: Nose normal.     Mouth/Throat:     Mouth: Mucous membranes are moist.  Eyes:     Pupils: Pupils are equal, round, and reactive to light.  Cardiovascular:     Rate and Rhythm: Normal rate and regular rhythm.  Pulmonary:     Effort: Pulmonary effort is normal.     Breath sounds: Normal breath sounds.  Abdominal:     General: Bowel sounds are normal.     Palpations: Abdomen is soft.  Skin:  General: Skin is warm and dry.     Capillary Refill: Capillary refill takes less than 2 seconds.  Neurological:     General: No focal deficit present.     Mental Status: She is alert and oriented to person, place, and time.  Psychiatric:        Mood and  Affect: Mood normal.        Behavior: Behavior normal.     Lab Results  Component Value Date   WBC 6.4 02/10/2022   HGB 14.5 02/10/2022   HCT 41.7 02/10/2022   PLT 253 02/10/2022   GLUCOSE 163 (H) 05/03/2022   CHOL 292 (H) 02/10/2022   TRIG 152 (H) 02/10/2022   HDL 55 02/10/2022   LDLCALC 209 (H) 02/10/2022   ALT 57 (H) 05/03/2022   AST 28 05/03/2022   NA 140 05/03/2022   K 4.7 05/03/2022   CL 102 05/03/2022   CREATININE 0.73 05/03/2022   BUN 7 05/03/2022   CO2 22 05/03/2022   TSH 0.856 02/10/2022   HGBA1C 7.3 (H) 02/10/2022      Assessment & Plan:   1. Type 2 diabetes mellitus with hyperglycemia, without long-term current use of insulin (HCC) - CBC with Differential/Platelet; Future - Comprehensive metabolic panel; Future - Hemoglobin A1c; Future - tirzepatide [facility]) 2.5 MG/0.5ML Pen; Inject 2.5 mg into the skin once a week.  Dispense: 2 mL; Refill: 0  2. Hypertension associated with diabetes ([place]) - CBC with Differential/Platelet; Future - Comprehensive metabolic panel; Future - Hemoglobin A1c; Future - Lipid panel; Future - lisinopril (ZESTRIL) 10 MG tablet; Take 1 tablet (10 mg total) by mouth daily.  Dispense: 90 tablet; Refill: 3  3. Class 3 severe obesity due to excess calories with serious comorbidity and body mass index (BMI) of 40.0 to 44.9 in adult (HCC) - CBC with Differential/Platelet; Future - Comprehensive metabolic panel; Future - Hemoglobin A1c; Future - Lipid panel; Future  4. Gastroesophageal reflux disease, unspecified whether esophagitis present - CBC with Differential/Platelet; Future - Comprehensive metabolic panel; Future - pantoprazole (PROTONIX) 40 MG tablet; Take 1 tablet (40 mg total) by mouth daily.  Dispense: 30 tablet; Refill: 3  5. Need for immunization against influenza - Flu Vaccine QUAD 30moIM (Fluarix, Fluzone & Alfiuria Quad PF)     Begin Mounjaro 2.5 mg injections weekly Begin Protonix 40 mg daily for GERD Notify  office of any adverse side effects on medicines immediately Return in 4 weeks for fasting labs Follow-up appointment in 374-month   Follow-up: 4-week return for labs; 3-87-monthhronic f/u   An After Visit Summary was printed and given to the patient.  ShaRip HarbourP [phone]

## 2022-08-10 NOTE — Patient Instructions (Addendum)
Begin Mounjaro 2.5 mg injections weekly Begin Protonix 40 mg daily for GERD Notify office of any adverse side effects on medicines immediately Return in 4 weeks for fasting labs Follow-up appointment in 72-month   Diabetes Mellitus and FAckerlycare is an important part of your health, especially when you have diabetes. Diabetes may cause you to have problems because of poor blood flow (circulation) to your feet and legs, which can cause your skin to: Become thinner and drier. Break more easily. Heal more slowly. Peel and crack. You may also have nerve damage (neuropathy) in your legs and feet, causing decreased feeling in them. This means that you may not notice minor injuries to your feet that could lead to more serious problems. Noticing and addressing any potential problems early is the best way to prevent future foot problems. How to care for your feet Foot hygiene  Wash your feet daily with warm water and mild soap. Do not use hot water. Then, pat your feet and the areas between your toes until they are completely dry. Do not soak your feet as this can dry your skin. Trim your toenails straight across. Do not dig under them or around the cuticle. File the edges of your nails with an emery board or nail file. Apply a moisturizing lotion or petroleum jelly to the skin on your feet and to dry, brittle toenails. Use lotion that does not contain alcohol and is unscented. Do not apply lotion between your toes. Shoes and socks Wear clean socks or stockings every day. Make sure they are not too tight. Do not wear knee-high stockings since they may decrease blood flow to your legs. Wear shoes that fit properly and have enough cushioning. Always look in your shoes before you put them on to be sure there are no objects inside. To break in new shoes, wear them for just a few hours a day. This prevents injuries on your feet. Wounds, scrapes, corns, and calluses  Check your feet daily for  blisters, cuts, bruises, sores, and redness. If you cannot see the bottom of your feet, use a mirror or ask someone for help. Do not cut corns or calluses or try to remove them with medicine. If you find a minor scrape, cut, or break in the skin on your feet, keep it and the skin around it clean and dry. You may clean these areas with mild soap and water. Do not clean the area with peroxide, alcohol, or iodine. If you have a wound, scrape, corn, or callus on your foot, look at it several times a day to make sure it is healing and not infected. Check for: Redness, swelling, or pain. Fluid or blood. Warmth. Pus or a bad smell. General tips Do not cross your legs. This may decrease blood flow to your feet. Do not use heating pads or hot water bottles on your feet. They may burn your skin. If you have lost feeling in your feet or legs, you may not know this is happening until it is too late. Protect your feet from hot and cold by wearing shoes, such as at the beach or on hot pavement. Schedule a complete foot exam at least once a year (annually) or more often if you have foot problems. Report any cuts, sores, or bruises to your health care provider immediately. Where to find more information American Diabetes Association: www.diabetes.org Association of Diabetes Care & Education Specialists: www.diabeteseducator.org Contact a health care provider if: You have a  medical condition that increases your risk of infection and you have any cuts, sores, or bruises on your feet. You have an injury that is not healing. You have redness on your legs or feet. You feel burning or tingling in your legs or feet. You have pain or cramps in your legs and feet. Your legs or feet are numb. Your feet always feel cold. You have pain around any toenails. Get help right away if: You have a wound, scrape, corn, or callus on your foot and: You have pain, swelling, or redness that gets worse. You have fluid or blood  coming from the wound, scrape, corn, or callus. Your wound, scrape, corn, or callus feels warm to the touch. You have pus or a bad smell coming from the wound, scrape, corn, or callus. You have a fever. You have a red line going up your leg. Summary Check your feet every day for blisters, cuts, bruises, sores, and redness. Apply a moisturizing lotion or petroleum jelly to the skin on your feet and to dry, brittle toenails. Wear shoes that fit properly and have enough cushioning. If you have foot problems, report any cuts, sores, or bruises to your health care provider immediately. Schedule a complete foot exam at least once a year (annually) or more often if you have foot problems. This information is not intended to replace advice given to you by your health care provider. Make sure you discuss any questions you have with your health care provider. Document Revised: 05/14/2020 Document Reviewed: 05/14/2020 Elsevier Patient Education  [place].

## 2022-09-05 ENCOUNTER — Ambulatory Visit: Payer: 59 | Admitting: Nurse Practitioner

## 2022-09-07 ENCOUNTER — Other Ambulatory Visit: Payer: BC Managed Care – PPO

## 2022-09-07 DIAGNOSIS — Z6841 Body Mass Index (BMI) 40.0 and over, adult: Secondary | ICD-10-CM

## 2022-09-07 DIAGNOSIS — E1159 Type 2 diabetes mellitus with other circulatory complications: Secondary | ICD-10-CM

## 2022-09-07 DIAGNOSIS — I152 Hypertension secondary to endocrine disorders: Secondary | ICD-10-CM | POA: Diagnosis not present

## 2022-09-07 DIAGNOSIS — E1165 Type 2 diabetes mellitus with hyperglycemia: Secondary | ICD-10-CM | POA: Diagnosis not present

## 2022-09-07 DIAGNOSIS — K219 Gastro-esophageal reflux disease without esophagitis: Secondary | ICD-10-CM

## 2022-09-08 LAB — CBC WITH DIFFERENTIAL/PLATELET
Basophils Absolute: 0.1 10*3/uL (ref 0.0–0.2)
Basos: 1 %
EOS (ABSOLUTE): 0.2 10*3/uL (ref 0.0–0.4)
Eos: 3 %
Hematocrit: 43.4 % (ref 34.0–46.6)
Hemoglobin: 14.8 g/dL (ref 11.1–15.9)
Immature Grans (Abs): 0 10*3/uL (ref 0.0–0.1)
Immature Granulocytes: 0 %
Lymphocytes Absolute: 1.7 10*3/uL (ref 0.7–3.1)
Lymphs: 24 %
MCH: 30.6 pg (ref 26.6–33.0)
MCHC: 34.1 g/dL (ref 31.5–35.7)
MCV: 90 fL (ref 79–97)
Monocytes Absolute: 0.7 10*3/uL (ref 0.1–0.9)
Monocytes: 9 %
Neutrophils Absolute: 4.5 10*3/uL (ref 1.4–7.0)
Neutrophils: 63 %
Platelets: 266 10*3/uL (ref 150–450)
RBC: 4.84 x10E6/uL (ref 3.77–5.28)
RDW: 12.3 % (ref 11.7–15.4)
WBC: 7.2 10*3/uL (ref 3.4–10.8)

## 2022-09-08 LAB — COMPREHENSIVE METABOLIC PANEL
ALT: 66 IU/L — ABNORMAL HIGH (ref 0–32)
AST: 21 IU/L (ref 0–40)
Albumin/Globulin Ratio: 2.7 — ABNORMAL HIGH (ref 1.2–2.2)
Albumin: 5.6 g/dL — ABNORMAL HIGH (ref 3.9–4.9)
Alkaline Phosphatase: 90 IU/L (ref 44–121)
BUN/Creatinine Ratio: 12 (ref 9–23)
BUN: 8 mg/dL (ref 6–24)
Bilirubin Total: 0.3 mg/dL (ref 0.0–1.2)
CO2: 21 mmol/L (ref 20–29)
Calcium: 9.7 mg/dL (ref 8.7–10.2)
Chloride: 100 mmol/L (ref 96–106)
Creatinine, Ser: 0.66 mg/dL (ref 0.57–1.00)
Globulin, Total: 2.1 g/dL (ref 1.5–4.5)
Glucose: 129 mg/dL — ABNORMAL HIGH (ref 70–99)
Potassium: 4.8 mmol/L (ref 3.5–5.2)
Sodium: 140 mmol/L (ref 134–144)
Total Protein: 7.7 g/dL (ref 6.0–8.5)
eGFR: 110 mL/min/{1.73_m2} (ref >59)

## 2022-09-08 LAB — LIPID PANEL
Chol/HDL Ratio: 5.1 ratio — ABNORMAL HIGH (ref 0.0–4.4)
Cholesterol, Total: 236 mg/dL — ABNORMAL HIGH (ref 100–199)
HDL: 46 mg/dL (ref >39)
LDL Chol Calc (NIH): 156 mg/dL — ABNORMAL HIGH (ref 0–99)
Triglycerides: 186 mg/dL — ABNORMAL HIGH (ref 0–149)
VLDL Cholesterol Cal: 34 mg/dL (ref 5–40)

## 2022-09-08 LAB — HEMOGLOBIN A1C
Est. average glucose Bld gHb Est-mCnc: 148 mg/dL
Hgb A1c MFr Bld: 6.8 % — ABNORMAL HIGH (ref 4.8–5.6)

## 2022-09-08 LAB — CARDIOVASCULAR RISK ASSESSMENT

## 2022-09-27 DIAGNOSIS — E119 Type 2 diabetes mellitus without complications: Secondary | ICD-10-CM | POA: Diagnosis not present

## 2022-09-27 LAB — HM DIABETES EYE EXAM

## 2022-10-05 ENCOUNTER — Encounter: Payer: Self-pay | Admitting: Nurse Practitioner

## 2022-11-06 DIAGNOSIS — G4733 Obstructive sleep apnea (adult) (pediatric): Secondary | ICD-10-CM | POA: Diagnosis not present

## 2022-11-14 DIAGNOSIS — G4733 Obstructive sleep apnea (adult) (pediatric): Secondary | ICD-10-CM | POA: Diagnosis not present

## 2022-11-22 ENCOUNTER — Encounter: Payer: Self-pay | Admitting: Nurse Practitioner

## 2022-11-22 ENCOUNTER — Ambulatory Visit: Payer: BC Managed Care – PPO | Admitting: Nurse Practitioner

## 2022-11-22 VITALS — BP 136/80 | HR 84 | Temp 97.4°F | Ht 62.0 in | Wt 221.0 lb

## 2022-11-22 DIAGNOSIS — N9989 Other postprocedural complications and disorders of genitourinary system: Secondary | ICD-10-CM | POA: Diagnosis not present

## 2022-11-22 DIAGNOSIS — K625 Hemorrhage of anus and rectum: Secondary | ICD-10-CM | POA: Diagnosis not present

## 2022-11-22 DIAGNOSIS — N941 Unspecified dyspareunia: Secondary | ICD-10-CM

## 2022-11-22 DIAGNOSIS — R1030 Lower abdominal pain, unspecified: Secondary | ICD-10-CM | POA: Diagnosis not present

## 2022-11-22 DIAGNOSIS — N393 Stress incontinence (female) (male): Secondary | ICD-10-CM

## 2022-11-22 LAB — POCT URINALYSIS DIP (CLINITEK)
Bilirubin, UA: NEGATIVE
Blood, UA: NEGATIVE
Glucose, UA: >=1000 mg/dL — AB
Ketones, POC UA: NEGATIVE mg/dL
Leukocytes, UA: NEGATIVE
Nitrite, UA: NEGATIVE
POC PROTEIN,UA: NEGATIVE
Spec Grav, UA: 1.015 (ref 1.010–1.025)
Urobilinogen, UA: 0.2 E.U./dL
pH, UA: 7 (ref 5.0–8.0)

## 2022-11-22 NOTE — Patient Instructions (Addendum)
We will call you with referral to uro-gynecologist and GI specialist We will call you with lab results and abdominal/pelvis CT appt Return stool study cards to office Follow-up as needed   Rectal Bleeding  Rectal bleeding is when blood comes out of the opening of the butt (anus). People with this kind of bleeding may notice bright red blood in their underwear or in the toilet after they poop (have a bowel movement). They may also have blood mixed with their poop (stool), or dark red or black poop. Rectal bleeding is often a sign that something is wrong. This condition can be caused by many things. It needs to be checked by a doctor. Your doctor will do tests to know what is causing your condition. Follow these instructions at home: Watch for any changes in your condition. Take these actions to help with bleeding and discomfort: Medicines Take over-the-counter and prescription medicines only as told by your doctor. Ask your doctor about changing or stopping your normal medicines. This is important if you are taking blood thinners. Medicines that thin the blood can make rectal bleeding worse. Managing constipation Your condition may cause trouble pooping (constipation). To prevent or treat trouble pooping, or to help make your poop soft, you may need to: Drink enough fluid to keep your pee (urine) pale yellow. Take over-the-counter or prescription medicines. Eat foods that are high in fiber. These include beans, whole grains, and fresh fruits and vegetables. Limit foods that are high in fat and sugar. These include fried or sweet foods.  General instructions Try not to strain when you poop. Try taking a warm bath. This may help with pain. Keep all follow-up visits as told by your doctor. This is important. Contact a doctor if: You have pain or swelling in your belly (abdomen). You have a fever. You feel weak. You feel like you may vomit. You cannot poop. Get help right away if: You have  new bleeding. You have more bleeding than before. You have black or dark red poop. You vomit blood or something that looks like coffee grounds. You pass out (faint). You have very bad pain in your butt. Summary Rectal bleeding is when blood comes out of the opening of the butt. This bleeding is often a sign that something is wrong. Eat a diet that is high in fiber. This will help to keep your poop soft. Talk to your doctor if you take medicines that thin the blood. These medicines can make bleeding worse. Get help right away if you have new or more bleeding, black or dark red poop, or blood in your vomit. Also, get help if you pass out or have very bad pain in your butt. This information is not intended to replace advice given to you by your health care provider. Make sure you discuss any questions you have with your health care provider. Document Revised: 09/25/2019 Document Reviewed: 09/25/2019 Elsevier Patient Education  [place].

## 2022-11-22 NOTE — Progress Notes (Signed)
Acute Office Visit  Subjective:    Patient ID: Chelsea Freeman, female    DOB: 05-Sep-1976, 47 y.o.   MRN: [id number]  CC: Abd pain  Rectal bleeding  HPI: Patient is in today for lower abd pain and rectal bleeding. Onset was about a week ago. She denies fever, chills, constipation, diarrhea, N/V, or changes in appetite. States she underwent hysterectomy with Dr Marin Roberts in 2020. States she has experienced stress incontinence, urinary frequency, and urgency post hysterectomy. Reports new onset dyspareunia and rectal pain after intercourse. She denies history of hemorrhoids. She has not undergone colon cancer screening.    Abdominal Pain  She reports new onset abdominal pain. The most recent episode started about a week ago and is staying constant. The abdominal pain is located in the right lower quadrant and left lower quadrant and does not radiate. It is described as aching, cramping, pressure-like, and fullness with bloating, is 5/10 in intensity, occurring intermittently. It is aggravated by nothing and is relieved by nothing. She has tried antacids and PPIs with little relief.    Recent GI studies: none Relevant medical history includes: hysterectomy, elevated liver enzymes  Previous labs Lab Results  Component Value Date   WBC 7.2 09/07/2022   HGB 14.8 09/07/2022   HCT 43.4 09/07/2022   MCV 90 09/07/2022   MCH 30.6 09/07/2022   RDW 12.3 09/07/2022   PLT 266 09/07/2022   Lab Results  Component Value Date   GLUCOSE 129 (H) 09/07/2022   NA 140 09/07/2022   K 4.8 09/07/2022   CL 100 09/07/2022   CO2 21 09/07/2022   BUN 8 09/07/2022   CREATININE 0.66 09/07/2022   GFRNONAA 110 07/27/2020   GFRAA 127 07/27/2020   CALCIUM 9.7 09/07/2022   PROT 7.7 09/07/2022   ALBUMIN 5.6 (H) 09/07/2022   LABGLOB 2.1 09/07/2022   AGRATIO 2.7 (H) 09/07/2022   BILITOT 0.3 09/07/2022   ALKPHOS 90 09/07/2022   AST 21 09/07/2022   ALT 66 (H) 09/07/2022     Past Medical History:   Diagnosis Date   Abnormal uterine bleeding (AUB) 06/28/2016   Anxiety disorder    Cellulitis    Dysmenorrhea 06/28/2016   Dysthymic disorder    Fatigue    Hyperlipidemia    Hyperprolactinemia (HCC)    Hypoxemia    Increased frequency of urination 06/28/2016   Intramural leiomyoma of uterus 06/28/2016   Obesity    Prediabetes    Sebaceous cyst    Sleep apnea    Type 2 diabetes mellitus without complications ([place])    Urge incontinence 06/28/2016    Past Surgical History:  Procedure Laterality Date   ABDOMINAL HYSTERECTOMY      Family History  Problem Relation Age of Onset   Hypertension Mother    Depression Mother    Atrial fibrillation Mother    Hypertension Father    Diabetes Father    Heart Problems Father    Parkinson's disease Father    Atrial fibrillation Sister    Thyroid disease Sister    Cancer Sister        Breast-remission   Heart Problems Sister    Breast cancer Sister    Bipolar disorder Sister    Kidney failure Sister    Cancer Maternal Grandfather        Leukemia   Heart Problems Paternal Grandmother    Heart failure Paternal Grandmother    Stroke Paternal Grandfather    Heart Problems Paternal Grandfather  Social History   Socioeconomic History   Marital status: Married    Spouse name: Jannette Cotham   Number of children: 2   Years of education: Not on file   Highest education level: Not on file  Occupational History   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  Tobacco Use   Smoking status: Never   Smokeless tobacco: Never  Substance and Sexual Activity   Alcohol use: Not Currently   Drug use: Not Currently   Sexual activity: Yes    Partners: Male  Other Topics Concern   Not on file  Social History Narrative   ** Merged History Encounter **       Social Determinants of Health   Financial Resource Strain: Low Risk  (02/10/2022)   Overall Financial Resource Strain (CARDIA)    Difficulty of Paying Living Expenses: Not hard at all  Food  Insecurity: No Food Insecurity (02/10/2022)   Hunger Vital Sign    Worried About Running Out of Food in the Last Year: Never true    [place] in the Last Year: Never true  Transportation Needs: No Transportation Needs (02/10/2022)   PRAPARE - Hydrologist (Medical): No    Lack of Transportation (Non-Medical): No  Physical Activity: Inactive (04/07/2022)   Exercise Vital Sign    Days of Exercise per Week: 0 days    Minutes of Exercise per Session: 0 min  Stress: No Stress Concern Present (02/10/2022)   [place]    Feeling of Stress : Not at all  Social Connections: Moderately Isolated (02/10/2022)   Social Connection and Isolation Panel [NHANES]    Frequency of Communication with Friends and Family: More than three times a week    Frequency of Social Gatherings with Friends and Family: More than three times a week    Attends Religious Services: Never    Marine scientist or Organizations: No    Attends Archivist Meetings: Never    Marital Status: Married  Human resources officer Violence: Not At Risk (02/10/2022)   Humiliation, Afraid, Rape, and Kick questionnaire    Fear of Current or Ex-Partner: No    Emotionally Abused: No    Physically Abused: No    Sexually Abused: No    Outpatient Medications Prior to Visit  Medication Sig Dispense Refill   ALPRAZolam (XANAX) 0.5 MG tablet Take 1 tablet (0.5 mg total) by mouth 2 (two) times daily as needed for anxiety. 20 tablet 0   dapagliflozin propanediol (FARXIGA) 10 MG TABS tablet Take 1 tablet (10 mg total) by mouth daily before breakfast. 90 tablet 1   desvenlafaxine (PRISTIQ) 100 MG 24 hr tablet Take 1 tablet (100 mg total) by mouth daily. 90 tablet 3   lisinopril (ZESTRIL) 10 MG tablet Take 1 tablet (10 mg total) by mouth daily. 90 tablet 3   magic mouthwash w/lidocaine SOLN Take 5 mLs by mouth 3 (three) times daily as needed for  mouth pain. 120 mL 1   metFORMIN (GLUCOPHAGE) 1000 MG tablet Take 1 tablet (1,000 mg total) by mouth 2 (two) times daily with a meal. 180 tablet 3   ondansetron (ZOFRAN) 4 MG tablet Take 1 tablet (4 mg total) by mouth every 8 (eight) hours as needed for nausea or vomiting. 20 tablet 0   pantoprazole (PROTONIX) 40 MG tablet Take 1 tablet (40 mg total) by mouth daily. 30 tablet 3   tirzepatide (MOUNJARO) 2.5 MG/0.5ML  Pen Inject 2.5 mg into the skin once a week. 2 mL 0   Vitamin D, Ergocalciferol, (DRISDOL) 1.25 MG (50000 UNIT) CAPS capsule Take 1 capsule (50,000 Units total) by mouth every 7 (seven) days. 5 capsule 3   No facility-administered medications prior to visit.    Allergies  Allergen Reactions   Crestor [Rosuvastatin]     Insomnia and indigestion   Zocor [Simvastatin]     Insomnia and myalgia and indigestion    Review of Systems  Constitutional:  Negative for appetite change, fever and unexpected weight change.  Gastrointestinal:  Positive for abdominal pain, anal bleeding, blood in stool and rectal pain. Negative for constipation, diarrhea, nausea and vomiting.  Genitourinary:  Positive for dyspareunia, frequency (stress incontinence) and urgency.       Objective:   BP 136/80   Pulse 84   Temp (!) 97.4 F (36.3 C)   Ht '5\' 2"'$  (1.575 m)   Wt 221 lb (100.2 kg)   LMP  (LMP Unknown)   SpO2 94%   BMI 40.42 kg/m   Physical Exam Vitals reviewed.  Constitutional:      Appearance: She is obese.  Cardiovascular:     Rate and Rhythm: Normal rate and regular rhythm.     Heart sounds: Normal heart sounds.  Pulmonary:     Effort: Pulmonary effort is normal.     Breath sounds: Normal breath sounds.  Abdominal:     General: Bowel sounds are normal.     Palpations: Abdomen is soft.     Tenderness: There is abdominal tenderness in the right lower quadrant, suprapubic area and left lower quadrant. There is no right CVA tenderness or left CVA tenderness.  Neurological:      Mental Status: She is alert.      Wt Readings from Last 3 Encounters:  08/10/22 226 lb (102.5 kg)  07/12/22 226 lb 6.4 oz (102.7 kg)  06/01/22 226 lb (102.5 kg)    Health Maintenance Due  Topic Date Due   FOOT EXAM  Never done   DTaP/Tdap/Td (1 - Tdap) Never done       Lab Results  Component Value Date   TSH 0.856 02/10/2022   Lab Results  Component Value Date   WBC 7.2 09/07/2022   HGB 14.8 09/07/2022   HCT 43.4 09/07/2022   MCV 90 09/07/2022   PLT 266 09/07/2022   Lab Results  Component Value Date   NA 140 09/07/2022   K 4.8 09/07/2022   CO2 21 09/07/2022   GLUCOSE 129 (H) 09/07/2022   BUN 8 09/07/2022   CREATININE 0.66 09/07/2022   BILITOT 0.3 09/07/2022   ALKPHOS 90 09/07/2022   AST 21 09/07/2022   ALT 66 (H) 09/07/2022   PROT 7.7 09/07/2022   ALBUMIN 5.6 (H) 09/07/2022   CALCIUM 9.7 09/07/2022   EGFR 110 09/07/2022   Lab Results  Component Value Date   CHOL 236 (H) 09/07/2022   Lab Results  Component Value Date   HDL 46 09/07/2022   Lab Results  Component Value Date   LDLCALC 156 (H) 09/07/2022   Lab Results  Component Value Date   TRIG 186 (H) 09/07/2022   Lab Results  Component Value Date   CHOLHDL 5.1 (H) 09/07/2022   Lab Results  Component Value Date   HGBA1C 6.8 (H) 09/07/2022       Assessment & Plan:    1. Rectal bleeding - Ambulatory referral to Gastroenterology - CBC with Differential/Platelet - Comprehensive metabolic  panel - CT Abdomen Pelvis W Contrast; Future -Return Hemoccult cards to office as directed  2. Lower abdominal pain - POCT URINALYSIS DIP (CLINITEK) - Ambulatory referral to Gastroenterology - Ambulatory referral to Urogynecology - CT Abdomen Pelvis W Contrast; Future -rest and push fluids  3. Dyspareunia, female - Ambulatory referral to Urogynecology  4. Stress incontinence after surgical procedure - Ambulatory referral to Urogynecology    We will call you with referral to uro-gynecologist  and GI specialist We will call you with lab results and abdominal/pelvis CT appt Return stool study cards to office Follow-up as needed   Follow-up: PRN  An After Visit Summary was printed and given to the patient.  I, Rip Harbour, NP, have reviewed all documentation for this visit. The documentation on 11/22/22 for the exam, diagnosis, procedures, and orders are all accurate and complete.   Rip Harbour, NP [place] [phone]

## 2022-11-23 LAB — COMPREHENSIVE METABOLIC PANEL
ALT: 102 IU/L — ABNORMAL HIGH (ref 0–32)
AST: 41 IU/L — ABNORMAL HIGH (ref 0–40)
Albumin/Globulin Ratio: 1.8 (ref 1.2–2.2)
Albumin: 5.1 g/dL — ABNORMAL HIGH (ref 3.9–4.9)
Alkaline Phosphatase: 91 IU/L (ref 44–121)
BUN/Creatinine Ratio: 13 (ref 9–23)
BUN: 9 mg/dL (ref 6–24)
Bilirubin Total: 0.3 mg/dL (ref 0.0–1.2)
CO2: 23 mmol/L (ref 20–29)
Calcium: 10.8 mg/dL — ABNORMAL HIGH (ref 8.7–10.2)
Chloride: 99 mmol/L (ref 96–106)
Creatinine, Ser: 0.69 mg/dL (ref 0.57–1.00)
Globulin, Total: 2.9 g/dL (ref 1.5–4.5)
Glucose: 104 mg/dL — ABNORMAL HIGH (ref 70–99)
Potassium: 4.7 mmol/L (ref 3.5–5.2)
Sodium: 139 mmol/L (ref 134–144)
Total Protein: 8 g/dL (ref 6.0–8.5)
eGFR: 108 mL/min/{1.73_m2} (ref >59)

## 2022-11-23 LAB — CBC WITH DIFFERENTIAL/PLATELET
Basophils Absolute: 0.1 10*3/uL (ref 0.0–0.2)
Basos: 1 %
EOS (ABSOLUTE): 0.2 10*3/uL (ref 0.0–0.4)
Eos: 2 %
Hematocrit: 43.2 % (ref 34.0–46.6)
Hemoglobin: 15 g/dL (ref 11.1–15.9)
Immature Grans (Abs): 0.1 10*3/uL (ref 0.0–0.1)
Immature Granulocytes: 1 %
Lymphocytes Absolute: 1.8 10*3/uL (ref 0.7–3.1)
Lymphs: 20 %
MCH: 30.4 pg (ref 26.6–33.0)
MCHC: 34.7 g/dL (ref 31.5–35.7)
MCV: 88 fL (ref 79–97)
Monocytes Absolute: 0.8 10*3/uL (ref 0.1–0.9)
Monocytes: 9 %
Neutrophils Absolute: 6.1 10*3/uL (ref 1.4–7.0)
Neutrophils: 67 %
Platelets: 257 10*3/uL (ref 150–450)
RBC: 4.93 x10E6/uL (ref 3.77–5.28)
RDW: 12.2 % (ref 11.7–15.4)
WBC: 9 10*3/uL (ref 3.4–10.8)

## 2022-11-27 ENCOUNTER — Ambulatory Visit (HOSPITAL_BASED_OUTPATIENT_CLINIC_OR_DEPARTMENT_OTHER)
Admission: RE | Admit: 2022-11-27 | Discharge: 2022-11-27 | Disposition: A | Discharge status: Home / Self Care | Payer: BC Managed Care – PPO | Source: Ambulatory Visit | Attending: Nurse Practitioner | Admitting: Nurse Practitioner

## 2022-11-27 DIAGNOSIS — K5900 Constipation, unspecified: Secondary | ICD-10-CM | POA: Diagnosis not present

## 2022-11-27 DIAGNOSIS — R1030 Lower abdominal pain, unspecified: Secondary | ICD-10-CM

## 2022-11-27 DIAGNOSIS — K625 Hemorrhage of anus and rectum: Secondary | ICD-10-CM | POA: Diagnosis not present

## 2022-11-27 MED ORDER — IOHEXOL 300 MG/ML  SOLN
100.0000 mL | Freq: Once | INTRAMUSCULAR | Status: AC | PRN
  Administered 2022-11-27: 100 mL via INTRAVENOUS

## 2022-12-01 ENCOUNTER — Other Ambulatory Visit: Payer: Self-pay | Admitting: Nurse Practitioner

## 2022-12-01 DIAGNOSIS — E1165 Type 2 diabetes mellitus with hyperglycemia: Secondary | ICD-10-CM

## 2022-12-02 ENCOUNTER — Other Ambulatory Visit: Payer: Self-pay | Admitting: Nurse Practitioner

## 2022-12-02 DIAGNOSIS — K769 Liver disease, unspecified: Secondary | ICD-10-CM

## 2022-12-02 DIAGNOSIS — N9489 Other specified conditions associated with female genital organs and menstrual cycle: Secondary | ICD-10-CM

## 2022-12-07 ENCOUNTER — Encounter: Payer: Self-pay | Admitting: Nurse Practitioner

## 2022-12-12 ENCOUNTER — Ambulatory Visit: Payer: BC Managed Care – PPO | Admitting: Nurse Practitioner

## 2022-12-16 ENCOUNTER — Ambulatory Visit (HOSPITAL_BASED_OUTPATIENT_CLINIC_OR_DEPARTMENT_OTHER)
Admission: RE | Admit: 2022-12-16 | Discharge: 2022-12-16 | Disposition: A | Discharge status: Home / Self Care | Payer: BC Managed Care – PPO | Source: Ambulatory Visit | Attending: Nurse Practitioner | Admitting: Nurse Practitioner

## 2022-12-16 DIAGNOSIS — K760 Fatty (change of) liver, not elsewhere classified: Secondary | ICD-10-CM | POA: Diagnosis not present

## 2022-12-16 DIAGNOSIS — K769 Liver disease, unspecified: Secondary | ICD-10-CM | POA: Diagnosis not present

## 2022-12-16 DIAGNOSIS — R1909 Other intra-abdominal and pelvic swelling, mass and lump: Secondary | ICD-10-CM | POA: Diagnosis not present

## 2022-12-16 DIAGNOSIS — N9489 Other specified conditions associated with female genital organs and menstrual cycle: Secondary | ICD-10-CM

## 2022-12-16 DIAGNOSIS — N83291 Other ovarian cyst, right side: Secondary | ICD-10-CM | POA: Diagnosis not present

## 2022-12-16 DIAGNOSIS — R160 Hepatomegaly, not elsewhere classified: Secondary | ICD-10-CM | POA: Diagnosis not present

## 2022-12-16 DIAGNOSIS — K7689 Other specified diseases of liver: Secondary | ICD-10-CM | POA: Insufficient documentation

## 2022-12-16 MED ORDER — GADOBUTROL 1 MMOL/ML IV SOLN
10.0000 mL | Freq: Once | INTRAVENOUS | Status: AC | PRN
  Administered 2022-12-16: 10 mL via INTRAVENOUS
  Filled 2022-12-16: qty 10

## 2022-12-21 ENCOUNTER — Ambulatory Visit: Payer: BC Managed Care – PPO | Admitting: Family Medicine

## 2022-12-21 VITALS — BP 118/62 | HR 110 | Ht 62.0 in | Wt 219.0 lb

## 2022-12-21 DIAGNOSIS — R1900 Intra-abdominal and pelvic swelling, mass and lump, unspecified site: Secondary | ICD-10-CM

## 2022-12-21 DIAGNOSIS — R160 Hepatomegaly, not elsewhere classified: Secondary | ICD-10-CM | POA: Diagnosis not present

## 2022-12-21 DIAGNOSIS — E118 Type 2 diabetes mellitus with unspecified complications: Secondary | ICD-10-CM | POA: Diagnosis not present

## 2022-12-21 DIAGNOSIS — N838 Other noninflammatory disorders of ovary, fallopian tube and broad ligament: Secondary | ICD-10-CM | POA: Diagnosis not present

## 2022-12-21 DIAGNOSIS — Z794 Long term (current) use of insulin: Secondary | ICD-10-CM

## 2022-12-21 NOTE — Progress Notes (Unsigned)
Subjective:  Patient ID: Chelsea Freeman, female    DOB: Mar 21, 1976  Age: 47 y.o. MRN: [id number]  No chief complaint on file.   HPI   Patient presents today to discuss [place] results:  "Complex RIGHT adnexal mass 8.0 x 5.2 x 3.6 cm containing some areas at appear to represent hemorrhagic cystic foci with additional areas at appear to represent solid soft tissue components. This lesion is indeterminate by ultrasound and an ovarian neoplasm is not excluded; further characterization by MR imaging with and without contrast is recommended.  Omitted from initial dictation is comparison to the prior CT study. The complex cystic lesion in the RIGHT adnexa as significantly increased in size since the previous CT exam suggesting interval hemorrhage into the lesion with increased size of the hemorrhagic cystic components. In addition to characterization by MR imaging, this lesion could potentially also be assessed by follow-up ultrasound in 6-12 weeks to reassess the hemorrhagic cystic components, with subsequent determination of MR characterization based on ultrasound follow-up."      08/10/2022    8:34 AM 06/01/2022    8:43 AM 05/03/2022    9:53 AM 04/07/2022    9:05 AM 02/10/2022   11:17 AM  Depression screen PHQ 2/9  Decreased Interest 0 1 3 3 $ 0  Down, Depressed, Hopeless 0 0 3 3 0  PHQ - 2 Score 0 1 6 6 $ 0  Altered sleeping 0 0 2 1 0  Tired, decreased energy 1 1 3 3 3  $ Change in appetite 0 0 0 3 3  Feeling bad or failure about yourself  0 0 0 2 0  Trouble concentrating 0 0 0 1 0  Moving slowly or fidgety/restless 0 0 0 0 0  Suicidal thoughts 0 0 0 0 0  PHQ-9 Score 1 2 11 16 6  $ Difficult doing work/chores Not difficult at all Not difficult at all   Not difficult at all         02/10/2022   11:09 AM 07/12/2022    9:10 AM  [place] in the past year? 0 0  Was there an injury with Fall? 0 0  Fall Risk Category Calculator 0 0  Fall Risk Category (Retired) Low Low   (RETIRED) Patient Fall Risk Level Low fall risk Low fall risk  Patient at Risk for Falls Due to No Fall Risks No Fall Risks  Fall risk Follow up Falls evaluation completed Falls evaluation completed      Review of Systems  Constitutional:  Negative for chills, fatigue and fever.  HENT:  Negative for congestion, ear pain, rhinorrhea and sore throat.   Respiratory:  Negative for cough and shortness of breath.   Cardiovascular:  Negative for chest pain.  Gastrointestinal:  Negative for abdominal pain, constipation, diarrhea, nausea and vomiting.  Genitourinary:  Negative for dysuria and urgency.  Musculoskeletal:  Negative for back pain and myalgias.  Neurological:  Negative for dizziness, weakness, light-headedness and headaches.  Psychiatric/Behavioral:  Negative for dysphoric mood. The patient is not nervous/anxious.     Current Outpatient Medications on File Prior to Visit  Medication Sig Dispense Refill   desvenlafaxine (PRISTIQ) 100 MG 24 hr tablet Take 1 tablet (100 mg total) by mouth daily. 90 tablet 3   lisinopril (ZESTRIL) 10 MG tablet Take 1 tablet (10 mg total) by mouth daily. 90 tablet 3   metFORMIN (GLUCOPHAGE) 1000 MG tablet Take 1 tablet (1,000 mg total) by mouth 2 (two) times daily with a meal.  180 tablet 3   ondansetron (ZOFRAN) 4 MG tablet Take 1 tablet (4 mg total) by mouth every 8 (eight) hours as needed for nausea or vomiting. 20 tablet 0   pantoprazole (PROTONIX) 40 MG tablet Take 1 tablet (40 mg total) by mouth daily. 30 tablet 3   Vitamin D, Ergocalciferol, (DRISDOL) 1.25 MG (50000 UNIT) CAPS capsule TAKE ONE CAPSULE BY MOUTH EVERY 7 DAYS 4 capsule 0   No current facility-administered medications on file prior to visit.   Past Medical History:  Diagnosis Date   Abnormal uterine bleeding (AUB) 06/28/2016   Anxiety disorder    Cellulitis    Dysmenorrhea 06/28/2016   Dysthymic disorder    Fatigue    Hyperlipidemia    Hyperprolactinemia (HCC)    Hypoxemia     Increased frequency of urination 06/28/2016   Intramural leiomyoma of uterus 06/28/2016   Obesity    Prediabetes    Sebaceous cyst    Sleep apnea    Type 2 diabetes mellitus without complications ([place])    Urge incontinence 06/28/2016   Past Surgical History:  Procedure Laterality Date   ABDOMINAL HYSTERECTOMY  09/03/2019   Total    Family History  Problem Relation Age of Onset   Hypertension Mother    Depression Mother    Atrial fibrillation Mother    Hypertension Father    Diabetes Father    Heart Problems Father    Parkinson's disease Father    Atrial fibrillation Sister    Thyroid disease Sister    Cancer Sister        Breast-remission   Heart Problems Sister    Breast cancer Sister    Bipolar disorder Sister    Kidney failure Sister    Cancer Maternal Grandfather        Leukemia   Heart Problems Paternal Grandmother    Heart failure Paternal Grandmother    Stroke Paternal Grandfather    Heart Problems Paternal Grandfather    Social History   Socioeconomic History   Marital status: Married    Spouse name: Chelsea Freeman   Number of children: 2   Years of education: Not on file   Highest education level: Not on file  Occupational History   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  Tobacco Use   Smoking status: Never   Smokeless tobacco: Never  Substance and Sexual Activity   Alcohol use: Not Currently   Drug use: Not Currently   Sexual activity: Yes    Partners: Male  Other Topics Concern   Not on file  Social History Narrative   ** Merged History Encounter **       Social Determinants of Health   Financial Resource Strain: Low Risk  (02/10/2022)   Overall Financial Resource Strain (CARDIA)    Difficulty of Paying Living Expenses: Not hard at all  Food Insecurity: No Food Insecurity (02/10/2022)   Hunger Vital Sign    Worried About Running Out of Food in the Last Year: Never true    Ran Out of Food in the Last Year: Never true  Transportation Needs: No Transportation  Needs (02/10/2022)   PRAPARE - Hydrologist (Medical): No    Lack of Transportation (Non-Medical): No  Physical Activity: Inactive (04/07/2022)   Exercise Vital Sign    Days of Exercise per Week: 0 days    Minutes of Exercise per Session: 0 min  Stress: No Stress Concern Present (02/10/2022)   [place] -  Occupational Stress Questionnaire    Feeling of Stress : Not at all  Social Connections: Moderately Isolated (02/10/2022)   Social Connection and Isolation Panel [NHANES]    Frequency of Communication with Friends and Family: More than three times a week    Frequency of Social Gatherings with Friends and Family: More than three times a week    Attends Religious Services: Never    Printmaker: No    Attends Music therapist: Never    Marital Status: Married    Objective:  BP 118/62   Pulse (!) 110   Ht 5' 2"$  (1.575 m)   Wt 219 lb (99.3 kg)   LMP  (LMP Unknown)   SpO2 97%   BMI 40.06 kg/m      12/21/2022    3:29 PM 11/22/2022    3:51 PM 08/10/2022    8:25 AM  BP/Weight  Systolic BP [id number]  Diastolic BP 62 80 80  Wt. (Lbs) 219 221 226  BMI 40.06 kg/m2 40.42 kg/m2 40.68 kg/m2    Physical Exam  Diabetic Foot Exam - Simple   No data filed      Lab Results  Component Value Date   WBC 9.0 11/22/2022   HGB 15.0 11/22/2022   HCT 43.2 11/22/2022   PLT 257 11/22/2022   GLUCOSE 104 (H) 11/22/2022   CHOL 236 (H) 09/07/2022   TRIG 186 (H) 09/07/2022   HDL 46 09/07/2022   LDLCALC 156 (H) 09/07/2022   ALT 102 (H) 11/22/2022   AST 41 (H) 11/22/2022   NA 139 11/22/2022   K 4.7 11/22/2022   CL 99 11/22/2022   CREATININE 0.69 11/22/2022   BUN 9 11/22/2022   CO2 23 11/22/2022   TSH 0.856 02/10/2022   HGBA1C 6.8 (H) 09/07/2022      Assessment & Plan:    Pelvic mass in female     No orders of the defined types were placed in this encounter.   No orders of the  defined types were placed in this encounter.    Follow-up: No follow-ups on file.   I,Chelsea Freeman,acting as a scribe for Chelsea Brome, MD.,have documented all relevant documentation on the behalf of Chelsea Brome, MD,as directed by  Chelsea Brome, MD while in the presence of Chelsea Brome, MD.   An After Visit Summary was printed and given to the patient.  Chelsea Brome, MD [facility] [phone]

## 2022-12-22 ENCOUNTER — Other Ambulatory Visit: Payer: Self-pay

## 2022-12-22 ENCOUNTER — Encounter: Payer: Self-pay | Admitting: Family Medicine

## 2022-12-22 ENCOUNTER — Other Ambulatory Visit: Payer: Self-pay | Admitting: Family Medicine

## 2022-12-22 DIAGNOSIS — R1900 Intra-abdominal and pelvic swelling, mass and lump, unspecified site: Secondary | ICD-10-CM | POA: Insufficient documentation

## 2022-12-22 DIAGNOSIS — R160 Hepatomegaly, not elsewhere classified: Secondary | ICD-10-CM

## 2022-12-22 DIAGNOSIS — N838 Other noninflammatory disorders of ovary, fallopian tube and broad ligament: Secondary | ICD-10-CM | POA: Insufficient documentation

## 2022-12-22 LAB — CBC WITH DIFFERENTIAL/PLATELET
Basophils Absolute: 0.1 10*3/uL (ref 0.0–0.2)
Basos: 1 %
EOS (ABSOLUTE): 0.2 10*3/uL (ref 0.0–0.4)
Eos: 2 %
Hematocrit: 42.9 % (ref 34.0–46.6)
Hemoglobin: 14.5 g/dL (ref 11.1–15.9)
Immature Grans (Abs): 0 10*3/uL (ref 0.0–0.1)
Immature Granulocytes: 1 %
Lymphocytes Absolute: 1.9 10*3/uL (ref 0.7–3.1)
Lymphs: 22 %
MCH: 29.9 pg (ref 26.6–33.0)
MCHC: 33.8 g/dL (ref 31.5–35.7)
MCV: 89 fL (ref 79–97)
Monocytes Absolute: 0.8 10*3/uL (ref 0.1–0.9)
Monocytes: 9 %
Neutrophils Absolute: 5.7 10*3/uL (ref 1.4–7.0)
Neutrophils: 65 %
Platelets: 306 10*3/uL (ref 150–450)
RBC: 4.85 x10E6/uL (ref 3.77–5.28)
RDW: 12 % (ref 11.7–15.4)
WBC: 8.6 10*3/uL (ref 3.4–10.8)

## 2022-12-22 LAB — COMPREHENSIVE METABOLIC PANEL
ALT: 74 IU/L — ABNORMAL HIGH (ref 0–32)
AST: 32 IU/L (ref 0–40)
Albumin/Globulin Ratio: 1.8 (ref 1.2–2.2)
Albumin: 5 g/dL — ABNORMAL HIGH (ref 3.9–4.9)
Alkaline Phosphatase: 87 IU/L (ref 44–121)
BUN/Creatinine Ratio: 11 (ref 9–23)
BUN: 8 mg/dL (ref 6–24)
Bilirubin Total: 0.3 mg/dL (ref 0.0–1.2)
CO2: 20 mmol/L (ref 20–29)
Calcium: 10.4 mg/dL — ABNORMAL HIGH (ref 8.7–10.2)
Chloride: 102 mmol/L (ref 96–106)
Creatinine, Ser: 0.73 mg/dL (ref 0.57–1.00)
Globulin, Total: 2.8 g/dL (ref 1.5–4.5)
Glucose: 118 mg/dL — ABNORMAL HIGH (ref 70–99)
Potassium: 4.5 mmol/L (ref 3.5–5.2)
Sodium: 142 mmol/L (ref 134–144)
Total Protein: 7.8 g/dL (ref 6.0–8.5)
eGFR: 103 mL/min/{1.73_m2} (ref >59)

## 2022-12-22 LAB — HEMOGLOBIN A1C
Est. average glucose Bld gHb Est-mCnc: 148 mg/dL
Hgb A1c MFr Bld: 6.8 % — ABNORMAL HIGH (ref 4.8–5.6)

## 2022-12-22 LAB — AFP TUMOR MARKER: AFP, Serum, Tumor Marker: 18 ng/mL — ABNORMAL HIGH (ref 0.0–6.4)

## 2022-12-22 LAB — CA 125: Cancer Antigen (CA) 125: 10.8 U/mL (ref 0.0–38.1)

## 2022-12-22 LAB — CEA: CEA: 6.9 ng/mL — ABNORMAL HIGH (ref 0.0–4.7)

## 2022-12-22 NOTE — Assessment & Plan Note (Signed)
Complicated by obesity and hyperlipidemia. Continue work on [employer] and exercise. Check feet daily. Does not need to check sugars daily. Continue metformin 1000 mg twice daily.

## 2022-12-22 NOTE — Assessment & Plan Note (Signed)
Check CMP, AFP, CEA. Refer to oncology for further workup if markers positive.

## 2022-12-22 NOTE — Assessment & Plan Note (Signed)
Ovarian mass: Check CA125.  Keep appoint with gynecology.

## 2022-12-23 ENCOUNTER — Inpatient Hospital Stay: Payer: BC Managed Care – PPO | Admitting: Hematology and Oncology

## 2022-12-23 ENCOUNTER — Ambulatory Visit: Payer: BC Managed Care – PPO | Admitting: Nurse Practitioner

## 2022-12-23 ENCOUNTER — Inpatient Hospital Stay: Payer: BC Managed Care – PPO

## 2022-12-23 ENCOUNTER — Other Ambulatory Visit: Payer: BC Managed Care – PPO

## 2022-12-23 DIAGNOSIS — R160 Hepatomegaly, not elsewhere classified: Secondary | ICD-10-CM

## 2022-12-24 LAB — LIPID PANEL
Chol/HDL Ratio: 5.2 ratio — ABNORMAL HIGH (ref 0.0–4.4)
Cholesterol, Total: 236 mg/dL — ABNORMAL HIGH (ref 100–199)
HDL: 45 mg/dL (ref >39)
LDL Chol Calc (NIH): 156 mg/dL — ABNORMAL HIGH (ref 0–99)
Triglycerides: 193 mg/dL — ABNORMAL HIGH (ref 0–149)
VLDL Cholesterol Cal: 35 mg/dL (ref 5–40)

## 2022-12-24 LAB — CARDIOVASCULAR RISK ASSESSMENT

## 2022-12-26 ENCOUNTER — Other Ambulatory Visit: Payer: Self-pay

## 2022-12-26 ENCOUNTER — Encounter: Payer: Self-pay | Admitting: *Deleted

## 2022-12-26 MED ORDER — EZETIMIBE 10 MG PO TABS: 10.0000 mg | ORAL_TABLET | Freq: Every day | ORAL | 2 refills | Status: DC

## 2022-12-26 MED ORDER — ICOSAPENT ETHYL 1 G PO CAPS: 2.0000 g | ORAL_CAPSULE | Freq: Two times a day (BID) | ORAL | 2 refills | Status: DC

## 2022-12-26 NOTE — Progress Notes (Signed)
Cholesterol: Was very high.  LDL 156.  Triglycerides 193.  Recommend Zetia 10 mg daily and Vascepa 1 g 2 capsules twice daily. HBA1C: 6.8.

## 2022-12-29 ENCOUNTER — Other Ambulatory Visit: Payer: Self-pay

## 2022-12-29 ENCOUNTER — Encounter: Payer: Self-pay | Admitting: Family Medicine

## 2022-12-29 DIAGNOSIS — R160 Hepatomegaly, not elsewhere classified: Secondary | ICD-10-CM

## 2022-12-29 DIAGNOSIS — Z1211 Encounter for screening for malignant neoplasm of colon: Secondary | ICD-10-CM

## 2023-01-03 ENCOUNTER — Encounter: Payer: Self-pay | Admitting: Gastroenterology

## 2023-01-05 DIAGNOSIS — N9489 Other specified conditions associated with female genital organs and menstrual cycle: Secondary | ICD-10-CM | POA: Diagnosis not present

## 2023-01-05 DIAGNOSIS — R1031 Right lower quadrant pain: Secondary | ICD-10-CM | POA: Diagnosis not present

## 2023-01-05 DIAGNOSIS — G8929 Other chronic pain: Secondary | ICD-10-CM | POA: Diagnosis not present

## 2023-01-06 ENCOUNTER — Other Ambulatory Visit (INDEPENDENT_AMBULATORY_CARE_PROVIDER_SITE_OTHER): Payer: BC Managed Care – PPO

## 2023-01-06 ENCOUNTER — Encounter: Payer: Self-pay | Admitting: Gastroenterology

## 2023-01-06 ENCOUNTER — Ambulatory Visit: Payer: BC Managed Care – PPO | Admitting: Gastroenterology

## 2023-01-06 VITALS — BP 110/58 | HR 96 | Ht 62.0 in | Wt 220.0 lb

## 2023-01-06 DIAGNOSIS — R932 Abnormal findings on diagnostic imaging of liver and biliary tract: Secondary | ICD-10-CM

## 2023-01-06 DIAGNOSIS — K760 Fatty (change of) liver, not elsewhere classified: Secondary | ICD-10-CM

## 2023-01-06 DIAGNOSIS — Z1212 Encounter for screening for malignant neoplasm of rectum: Secondary | ICD-10-CM | POA: Diagnosis not present

## 2023-01-06 DIAGNOSIS — Z1211 Encounter for screening for malignant neoplasm of colon: Secondary | ICD-10-CM

## 2023-01-06 LAB — IBC + FERRITIN
Ferritin: 66.9 ng/mL (ref 10.0–291.0)
Iron: 97 ug/dL (ref 42–145)
Saturation Ratios: 24.6 % (ref 20.0–50.0)
TIBC: 394.8 ug/dL (ref 250.0–450.0)
Transferrin: 282 mg/dL (ref 212.0–360.0)

## 2023-01-06 NOTE — Patient Instructions (Signed)
_______________________________________________________  If your blood pressure at your visit was 140/90 or greater, please contact your primary care physician to follow up on this.  _______________________________________________________  If you are age 47 or older, your body mass index should be between 23-30. Your Body mass index is 40.24 kg/m. If this is out of the aforementioned range listed, please consider follow up with your Primary Care Provider.  If you are age 75 or younger, your body mass index should be between 19-25. Your Body mass index is 40.24 kg/m. If this is out of the aformentioned range listed, please consider follow up with your Primary Care Provider.   Your provider has requested that you go to the basement level for lab work before leaving today. Press "B" on the elevator. The lab is located at the first door on the left as you exit the elevator.  I will call you in May to schedule your MRI appointment in June.  You have been scheduled for a colonoscopy. Please follow written instructions given to you at your visit today.  Please pick up your prep supplies at the pharmacy within the next 1-3 days. If you use inhalers (even only as needed), please bring them with you on the day of your procedure.   The [facility] providers would like to encourage you to use [facility] to communicate with providers for non-urgent requests or questions.  Due to long hold times on the telephone, sending your provider a message by [facility] may be a faster and more efficient way to get a response.  Please allow 48 business hours for a response.  Please remember that this is for non-urgent requests.   It was a pleasure to see you today!  Thank you for trusting me with your gastrointestinal care!    Scott E.Candis Schatz, MD

## 2023-01-06 NOTE — Progress Notes (Signed)
HPI : Chelsea Freeman is a very pleasant 47 year old female with a history of diabetes, obesity and anxiety who is referred to [place] by Dr. Rochel Brome for further evaluation of incidentally noted liver lesions.  The patient had initially been found to have liver lesions in 2021 after a coronary CT.  A subsequent MRI (Oct 2021) revealed a 3.2x4cm septated cyst in the right hepatic dome and additional smaller scattered cysts throughout the liver.  The lesions appeared consistent with simple hepatic cysts and no further work up or surveillance was recommended.  In January, a CT abdomen/pelvis was ordered by her PCP due to lower abdominal pain, concerning for possible diverticulitis.  The CT again showed multiple liver cysts, including the previously noted septated cyst in the dome of the liver, now 4.3 x 3.5 cm, as well as a new indeterminate 3.1x3.0cm lesion in the inferior right lobe.  In addition, a 3.5x2.6 cm cystic structure was seen in the pelvis abutting the vaginal cuff.  A subsequent MRI liver w/wout contrast on Dec 16, 2022 showed the indeterminate lesion as well as numerous mildly enhancing subcentimeter lesions that were difficult to characterize, but were favored to be most likely [place] vs hepatic adenomas.  Repeat imaging with EOVIST was recommended in 3-6 months.  A pelvic ultrasound also on Dec 16, 2022 showed a complex adnexal mass 8x5.2x3.6cm concerning for neoplasm.  She saw her gynecologist yesterday and is planning for surgery (oopherectomy) next week  She has a history of chronically elevated liver enzymes and fatty liver.  She does not drink alcohol.  She denies any family history of cirrhosis but reports that her mother has liver cysts and her father has fatty liver. No history of IVDU.  She was on oral contraceptives for many years, but had a hysterectomy 3 years ago and has not taken any estrogen/progesterone since then.  She reports having a constant dull ache in her right side and a  more bothersome lower abdominal pain that is newer.  She has regular bowel movements.  No problems with constipation, diarrhea or blood in the stool. She has never had a colonoscopy.  No family history of colon cancer.   Component Ref Range & Units 2 wk ago  CEA 0.0 - 4.7 ng/mL 6.9 High   Comment:                              Nonsmokers          <3.9                              Smokers             <5.6   Component Ref Range & Units 2 wk ago  AFP, Serum, Tumor Marker 0.0 - 6.4 ng/mL 18.0 High      Past Medical History:  Diagnosis Date   Abnormal uterine bleeding (AUB) 06/28/2016   Anxiety disorder    Cellulitis    Dysmenorrhea 06/28/2016   Dysthymic disorder    Fatigue    Hyperlipidemia    Hyperprolactinemia (HCC)    Hypoxemia    Increased frequency of urination 06/28/2016   Intramural leiomyoma of uterus 06/28/2016   Obesity    Prediabetes    Sebaceous cyst    Sleep apnea    Type 2 diabetes mellitus without complications ([place])    Urge incontinence 06/28/2016  Past Surgical History:  Procedure Laterality Date   ABDOMINAL HYSTERECTOMY  09/03/2019   Total   Family History  Problem Relation Age of Onset   Hypertension Mother    Depression Mother    Atrial fibrillation Mother    Hypertension Father    Diabetes Father    Heart Problems Father    Parkinson's disease Father    Atrial fibrillation Sister    Thyroid disease Sister    Cancer Sister        Breast-remission   Heart Problems Sister    Breast cancer Sister    Bipolar disorder Sister    Kidney failure Sister    Cancer Maternal Grandfather        Leukemia   Heart Problems Paternal Grandmother    Heart failure Paternal Grandmother    Stroke Paternal Grandfather    Heart Problems Paternal Grandfather    Social History   Tobacco Use   Smoking status: Never   Smokeless tobacco: Never  Substance Use Topics   Alcohol use: Not Currently   Drug use: Not Currently   Current Outpatient Medications   Medication Sig Dispense Refill   desvenlafaxine (PRISTIQ) 100 MG 24 hr tablet Take 1 tablet (100 mg total) by mouth daily. 90 tablet 3   ezetimibe (ZETIA) 10 MG tablet Take 1 tablet (10 mg total) by mouth daily. 30 tablet 2   FARXIGA 10 MG TABS tablet Take 10 mg by mouth every morning.     icosapent Ethyl (VASCEPA) 1 g capsule Take 2 capsules (2 g total) by mouth 2 (two) times daily. 120 capsule 2   lisinopril (ZESTRIL) 10 MG tablet Take 1 tablet (10 mg total) by mouth daily. 90 tablet 3   metFORMIN (GLUCOPHAGE) 1000 MG tablet Take 1 tablet (1,000 mg total) by mouth 2 (two) times daily with a meal. 180 tablet 3   ondansetron (ZOFRAN) 4 MG tablet Take 1 tablet (4 mg total) by mouth every 8 (eight) hours as needed for nausea or vomiting. 20 tablet 0   pantoprazole (PROTONIX) 40 MG tablet Take 1 tablet (40 mg total) by mouth daily. 30 tablet 3   Vitamin D, Ergocalciferol, (DRISDOL) 1.25 MG (50000 UNIT) CAPS capsule TAKE ONE CAPSULE BY MOUTH EVERY 7 DAYS 4 capsule 0   No current facility-administered medications for this visit.   Allergies  Allergen Reactions   Crestor [Rosuvastatin]     Insomnia and indigestion   Mounjaro [Tirzepatide]     Nausea, vomiting and abdominal pain.     Ozempic (0.25 Or 0.5 Mg-Dose) [Semaglutide(0.25 Or 0.'5mg'$ -Dos)]     Nausea, vomiting and abdominal pain.    Zocor [Simvastatin]     Insomnia and myalgia and indigestion     Review of Systems: All systems reviewed and negative except where noted in HPI.    US Pelvic Complete With Transvaginal  Addendum Date: 12/16/2022   ADDENDUM REPORT: 12/16/2022 16:11 ADDENDUM: Omitted from initial dictation is comparison to the prior CT study. The complex cystic lesion in the RIGHT adnexa as significantly increased in size since the previous CT exam suggesting interval hemorrhage into the lesion with increased size of the hemorrhagic cystic components. In addition to characterization by MR imaging, this lesion could  potentially also be assessed by follow-up ultrasound in 6-12 weeks to reassess the hemorrhagic cystic components, with subsequent determination of MR characterization based on ultrasound follow-up. Electronically Signed   By: Lavonia Dana M.D.   On: 12/16/2022 16:11   Result Date: 12/16/2022 CLINICAL  DATA:  RIGHT adnexal cyst, abnormal CT EXAM: TRANSABDOMINAL AND TRANSVAGINAL ULTRASOUND OF PELVIS TECHNIQUE: Both transabdominal and transvaginal ultrasound examinations of the pelvis were performed. Transabdominal technique was performed for global imaging of the pelvis including uterus, ovaries, adnexal regions, and pelvic cul-de-sac. It was necessary to proceed with endovaginal exam following the transabdominal exam to visualize the ovaries. COMPARISON:  CT abdomen and pelvis 11/27/2022, pelvic ultrasound 08/05/2019 FINDINGS: Uterus Surgically absent Endometrium Surgically absent Right ovary 3.6 x 2.5 x 1.0 cm = volume 4.7 mL.  Adjacent mass, see below Left ovary Not visualized, likely obscured by bowel Other findings Trace free pelvic fluid. Complex mass identified in RIGHT adnexa, overall 8.0 x 5.2 x 3.6 cm. Lesion contains multiple hypoechoic cystic regions with lacy internal architecture and scattered hypoechogenicity suggesting hemorrhagic cysts. Additional areas are somewhat more solid in appearance and contain internal blood flow on color Doppler imaging. This represents an indeterminate complex RIGHT adnexal mass. IMPRESSION: Surgical absence of uterus with nonvisualization of LEFT ovary. Complex RIGHT adnexal mass 8.0 x 5.2 x 3.6 cm containing some areas at appear to represent hemorrhagic cystic foci with additional areas at appear to represent solid soft tissue components. This lesion is indeterminate by ultrasound and an ovarian neoplasm is not excluded; further characterization by MR imaging with and without contrast is recommended. These results will be called to the ordering clinician or representative  by the Radiologist Assistant, and communication documented in the PACS or [employer]. Electronically Signed: By: Lavonia Dana M.D. On: 12/16/2022 16:06   MR LIVER W WO CONTRAST  Result Date: 12/16/2022 CLINICAL DATA:  Hepatic lesions.  Intermittent dull pain x3 years EXAM: MRI ABDOMEN WITHOUT AND WITH CONTRAST TECHNIQUE: Multiplanar multisequence MR imaging of the abdomen was performed both before and after the administration of intravenous contrast. CONTRAST:  34m GADAVIST GADOBUTROL 1 MMOL/ML IV SOLN COMPARISON:  MRI abdomen August 08, 2020 and CT abdomen pelvis November 27, 2022 FINDINGS: Lower chest: No acute abnormality. Hepatobiliary: Hepatomegaly with marked diffuse hepatic steatosis. Stable bilobar hepatic cysts some of which demonstrate internal septations but none of which demonstrate nodularity or septal/wall thickening. Segment V/VI hepatic lesion intrinsically T1 hyperintense to liver measuring 3.2 x 2.5 cm on image 63/11 with a central area of T1 hypointensity lesion is T2 hypointense to background liver with some central T2 hyperintensity seen on image 30/5. No reduced diffusivity or loss of signal on out of phase imaging. Low-level postcontrast enhancement throughout the lesion more intense contrast enhancement in the central intrinsically T1 hypointense portion of the lesion which persists throughout delayed postcontrast pulse sequences. In retrospect this lesion is very subtly evident on precontrast T1 and early arterial postcontrast pulse sequence measuring 2.7 cm on that examination. Intrinsically T1 hyperintense bilobar hepatic lesions for instance in segment II measuring 10 mm on image 26/11, segment VIII measuring 11 mm on image 29/11 and in the caudate lobe measuring 15 mm on image 35/11 motion degraded subtraction imaging limits the evaluation for postcontrast enhancement. Within this context these appear to demonstrate low-level arterial enhancement with persists which persists  throughout delayed postcontrast pulse sequences Gallbladder is unremarkable.  No biliary ductal dilation. Pancreas: No pancreatic ductal dilation or evidence of acute inflammation. Spleen:  No splenomegaly or focal splenic lesion. Adrenals/Urinary Tract: Bilateral adrenal glands appear normal no hydronephrosis. No solid enhancing renal mass. Stomach/Bowel: Visualized portions within the abdomen are unremarkable. Vascular/Lymphatic: Normal caliber abdominal aorta. Smooth IVC contours. Portal, splenic and superior mesenteric veins are patent. Other:  No  significant abdominal free fluid. Musculoskeletal: No suspicious bone lesions identified. IMPRESSION: 1. Enhancing segment V/VI hepatic lesion measuring 3.2 x 2.5 cm, as well as numerous mildly enhancing sub/pericentimeter intrinsically T1 hyperintense bilobar hepatic lesions. Nonspecific imaging findings, with characterization also made difficult by the marked background of hepatic steatosis but in the absence of known malignancy or hepatic cirrhosis are favored to reflect benign hepatic adenomas or FNH. Suggest follow-up MRI in 3-6 months with EOVIST contrast for potentially more definitive characterization and assessment of stability. 2. Hepatomegaly with marked diffuse hepatic steatosis. 3. Stable bilobar hepatic cysts some of which demonstrate internal septations but none of which demonstrate nodularity or septal/wall thickening. Electronically Signed   By: Dahlia Bailiff M.D.   On: 12/16/2022 10:28    MRI Oct 2021 IMPRESSION: Benign hepatic cysts, including a 4.0 cm septated cyst in the right hepatic dome, corresponding to the CT abnormality.   No suspicious/enhancing hepatic lesions   Physical Exam: BP (!) 110/58   Pulse 96   Ht '5\' 2"'$  (1.575 m)   Wt 220 lb (99.8 kg)   LMP  (LMP Unknown)   BMI 40.24 kg/m  Constitutional: Pleasant,well-developed, Caucasian female in no acute distress. HEENT: Normocephalic and atraumatic. Conjunctivae are normal.  No scleral icterus. Neck supple.  Cardiovascular: Normal rate, regular rhythm.  Pulmonary/chest: Effort normal and breath sounds normal. No wheezing, rales or rhonchi. Abdominal: Soft, nondistended, tenderness to palpation in the RUQ and lower abdomen without rigidity or guarding. Bowel sounds active throughout. There are no masses palpable. No hepatomegaly. Extremities: no edema Neurological: Alert and oriented to person place and time. Skin: Skin is warm and dry. No rashes noted. Psychiatric: Normal mood and affect. Behavior is normal.  CBC    Component Value Date/Time   WBC 8.6 12/21/2022 1554   RBC 4.85 12/21/2022 1554   HGB 14.5 12/21/2022 1554   HCT 42.9 12/21/2022 1554   PLT 306 12/21/2022 1554   MCV 89 12/21/2022 1554   MCH 29.9 12/21/2022 1554   MCHC 33.8 12/21/2022 1554   RDW 12.0 12/21/2022 1554   LYMPHSABS 1.9 12/21/2022 1554   EOSABS 0.2 12/21/2022 1554   BASOSABS 0.1 12/21/2022 1554    CMP     Component Value Date/Time   NA 142 12/21/2022 1554   K 4.5 12/21/2022 1554   CL 102 12/21/2022 1554   CO2 20 12/21/2022 1554   GLUCOSE 118 (H) 12/21/2022 1554   BUN 8 12/21/2022 1554   CREATININE 0.73 12/21/2022 1554   CALCIUM 10.4 (H) 12/21/2022 1554   PROT 7.8 12/21/2022 1554   ALBUMIN 5.0 (H) 12/21/2022 1554   AST 32 12/21/2022 1554   ALT 74 (H) 12/21/2022 1554   ALKPHOS 87 12/21/2022 1554   BILITOT 0.3 12/21/2022 1554   GFRNONAA 110 07/27/2020 1155   GFRAA 127 07/27/2020 1155     ASSESSMENT AND PLAN: 48 year old female with incidentally noted liver cysts in 2021, with new incidental noncystic liver lesions found in 2024.  Lesions appear most consistent with hepatic adenoma or FNH.  She has fatty liver but no evidence of cirrhosis from an imaging or lab standpoint.  Her AFP was slightly elevated, as was her CEA.  [place] would be extremely unusual in a patient without HBV or cirrhosis; suspect FNH or adenoma.  Metastatic lesion also possible. She has a concerning  adnexal mass and is likely undergoing resection next week.  CA125 normal. I do not think that she warrants a biopsy at this point.  I  would like to repeat an MRI in 3-4 months as well as AFP. Will evaluate for other causes of chronic liver disease (viral, autoimmune, genetic).  If she has HBV, then the concern for [facility] is increased. We discussed fatty liver disease and the relationship to diabetes, diet and exercise and the potential for progression to more advance liver disease.  She is due for her initial average risk screening colonoscopy.  Will schedule her for this at least 6 weeks out from her oopherectomy to ensure she has adequate time to recover.  Liver lesions - Repeat MRI w/ Eovist in 3-4 months - Repeat AFP in 3 months  Fatty liver - Likely MASLD - Exclude viral/autoimmune/genetic  Colon cancer screening - Colonoscopy (will need to wait 6 week following oopherectomy)  Caeleb Batalla E. Candis Schatz, MD [facility]  [facility], Elnita Maxwell, MD

## 2023-01-09 DIAGNOSIS — R1900 Intra-abdominal and pelvic swelling, mass and lump, unspecified site: Secondary | ICD-10-CM | POA: Diagnosis not present

## 2023-01-11 LAB — IGA: Immunoglobulin A: 343 mg/dL — ABNORMAL HIGH (ref 47–310)

## 2023-01-11 LAB — IGG: IgG (Immunoglobin G), Serum: 1015 mg/dL (ref 600–1640)

## 2023-01-11 LAB — HEPATITIS B SURFACE ANTIGEN: Hepatitis B Surface Ag: NONREACTIVE

## 2023-01-11 LAB — HEPATITIS B CORE ANTIBODY, TOTAL: Hep B Core Total Ab: NONREACTIVE

## 2023-01-11 LAB — ANTI-SMOOTH MUSCLE ANTIBODY, IGG: Actin (Smooth Muscle) Antibody (IGG): <20 U (ref <20)

## 2023-01-11 LAB — HEPATITIS C ANTIBODY: Hepatitis C Ab: NONREACTIVE

## 2023-01-11 LAB — HEPATITIS B SURFACE ANTIBODY,QUALITATIVE: Hep B S Ab: REACTIVE — AB

## 2023-01-11 LAB — CERULOPLASMIN: Ceruloplasmin: 36 mg/dL (ref 18–53)

## 2023-01-11 LAB — TISSUE TRANSGLUTAMINASE, IGA: (tTG) Ab, IgA: <1 U/mL

## 2023-01-11 LAB — ALPHA-1-ANTITRYPSIN: A-1 Antitrypsin, Ser: 126 mg/dL (ref 83–199)

## 2023-01-11 LAB — HEPATITIS A ANTIBODY, TOTAL: Hepatitis A AB,Total: NONREACTIVE

## 2023-01-15 NOTE — Progress Notes (Signed)
Chelsea Freeman,  All of your labs looked good.  Your Hepatitis B surface antibody is positive which indicates you are immune (through vaccination) to hepatitis B.  All other tests were negative.  Your total IgA level was slightly elevated.  This does not have any clinical significance and does not require further evaluation. Will plan to recheck your AFP levels in early May and repeat the MRI of the liver in May/June

## 2023-01-25 ENCOUNTER — Other Ambulatory Visit: Payer: Self-pay

## 2023-01-25 MED ORDER — VITAMIN D (ERGOCALCIFEROL) 1.25 MG (50000 UNIT) PO CAPS: 50000.0000 [IU] | ORAL_CAPSULE | ORAL | 2 refills | Status: DC

## 2023-01-26 ENCOUNTER — Other Ambulatory Visit: Payer: Self-pay

## 2023-01-26 MED ORDER — FARXIGA 10 MG PO TABS: 10.0000 mg | ORAL_TABLET | Freq: Every morning | ORAL | 2 refills | Status: DC

## 2023-02-03 ENCOUNTER — Ambulatory Visit: Payer: BC Managed Care – PPO | Admitting: Obstetrics and Gynecology

## 2023-02-04 DIAGNOSIS — G4733 Obstructive sleep apnea (adult) (pediatric): Secondary | ICD-10-CM | POA: Diagnosis not present

## 2023-02-13 DIAGNOSIS — N83201 Unspecified ovarian cyst, right side: Secondary | ICD-10-CM | POA: Diagnosis not present

## 2023-02-13 DIAGNOSIS — R102 Pelvic and perineal pain: Secondary | ICD-10-CM | POA: Diagnosis not present

## 2023-02-13 DIAGNOSIS — R1900 Intra-abdominal and pelvic swelling, mass and lump, unspecified site: Secondary | ICD-10-CM | POA: Diagnosis not present

## 2023-02-24 ENCOUNTER — Ambulatory Visit (AMBULATORY_SURGERY_CENTER): Payer: BC Managed Care – PPO

## 2023-02-24 VITALS — Ht 62.0 in | Wt 217.0 lb

## 2023-02-24 DIAGNOSIS — Z1212 Encounter for screening for malignant neoplasm of rectum: Secondary | ICD-10-CM

## 2023-02-24 DIAGNOSIS — R932 Abnormal findings on diagnostic imaging of liver and biliary tract: Secondary | ICD-10-CM

## 2023-02-24 DIAGNOSIS — Z1211 Encounter for screening for malignant neoplasm of colon: Secondary | ICD-10-CM

## 2023-02-24 DIAGNOSIS — K760 Fatty (change of) liver, not elsewhere classified: Secondary | ICD-10-CM

## 2023-02-24 MED ORDER — SUTAB 1479-225-188 MG PO TABS
24.0000 | ORAL_TABLET | ORAL | 0 refills | Status: DC
Start: 2023-02-24 — End: 2024-09-30

## 2023-02-24 MED ORDER — ONDANSETRON HCL 4 MG PO TABS
4.0000 mg | ORAL_TABLET | ORAL | 0 refills | Status: DC
Start: 2023-02-24 — End: 2023-10-02

## 2023-02-24 NOTE — Progress Notes (Signed)
No egg or soy allergy known to patient  No issues known to pt with past sedation with any surgeries or procedures Patient denies ever being told they had issues or difficulty with intubation  No FH of Malignant Hyperthermia Pt is not on diet pills Pt is not on  home 02  Pt is not on blood thinners  Pt denies issues with constipation  No A fib or A flutter Have any cardiac testing pending--no Pt instructed to use Singlecare.[email or web address] or GoodRx for a price reduction on prep   

## 2023-02-27 ENCOUNTER — Encounter: Payer: Self-pay | Admitting: Gastroenterology

## 2023-02-28 ENCOUNTER — Encounter: Payer: Self-pay | Admitting: Gastroenterology

## 2023-02-28 ENCOUNTER — Other Ambulatory Visit: Payer: Self-pay | Admitting: Family Medicine

## 2023-02-28 DIAGNOSIS — Z1231 Encounter for screening mammogram for malignant neoplasm of breast: Secondary | ICD-10-CM

## 2023-03-10 ENCOUNTER — Ambulatory Visit (AMBULATORY_SURGERY_CENTER): Payer: BC Managed Care – PPO | Admitting: Gastroenterology

## 2023-03-10 ENCOUNTER — Encounter: Payer: Self-pay | Admitting: Gastroenterology

## 2023-03-10 VITALS — BP 127/53 | HR 77 | Temp 98.6°F | Resp 16 | Ht 62.0 in | Wt 217.0 lb

## 2023-03-10 DIAGNOSIS — D123 Benign neoplasm of transverse colon: Secondary | ICD-10-CM | POA: Diagnosis not present

## 2023-03-10 DIAGNOSIS — Z1211 Encounter for screening for malignant neoplasm of colon: Secondary | ICD-10-CM | POA: Diagnosis not present

## 2023-03-10 DIAGNOSIS — K760 Fatty (change of) liver, not elsewhere classified: Secondary | ICD-10-CM

## 2023-03-10 MED ORDER — SODIUM CHLORIDE 0.9 % IV SOLN
500.0000 mL | Freq: Once | INTRAVENOUS | Status: AC
Start: 2023-03-10 — End: ?

## 2023-03-10 NOTE — Progress Notes (Signed)
Pt's states no medical or surgical changes since previsit or office visit. 

## 2023-03-10 NOTE — Op Note (Signed)
[facility] Patient Name: Chelsea Freeman Procedure Date: 03/10/2023 9:57 AM MRN: [id number] Endoscopist: Lorin Picket E. Tomasa Rand , MD, [id number] Age: 47 Referring MD:  Date of Birth: [birth year] Gender: Female Account #: [id number] Procedure:                Colonoscopy Indications:              Screening for colorectal malignant neoplasm, This                            is the patient's first colonoscopy Medicines:                Monitored Anesthesia Care Procedure:                Pre-Anesthesia Assessment:                           - Prior to the procedure, a History and Physical                            was performed, and patient medications and                            allergies were reviewed. The patient's tolerance of                            previous anesthesia was also reviewed. The risks                            and benefits of the procedure and the sedation                            options and risks were discussed with the patient.                            All questions were answered, and informed consent                            was obtained. Prior Anticoagulants: The patient has                            taken no anticoagulant or antiplatelet agents. ASA                            Grade Assessment: III - A patient with severe                            systemic disease. After reviewing the risks and                            benefits, the patient was deemed in satisfactory                            condition to undergo the procedure.  After obtaining informed consent, the colonoscope                            was passed under direct vision. Throughout the                            procedure, the patient's blood pressure, pulse, and                            oxygen saturations were monitored continuously. The                            CF [id number] #[id number] was introduced through the anus                            and advanced  to the the terminal ileum, with                            identification of the appendiceal orifice and IC                            valve. The colonoscopy was performed without                            difficulty. The patient tolerated the procedure                            well. The quality of the bowel preparation was                            adequate. The terminal ileum, ileocecal valve,                            appendiceal orifice, and rectum were photographed.                            The bowel preparation used was SUPREP via split                            dose instruction. Scope In: 10:12:58 AM Scope Out: 10:24:32 AM Scope Withdrawal Time: 0 hours 8 minutes 54 seconds  Total Procedure Duration: 0 hours 11 minutes 34 seconds  Findings:                 The perianal and digital rectal examinations were                            normal. Pertinent negatives include normal                            sphincter tone and no palpable rectal lesions.                           A 2 mm polyp was found in the transverse colon. The  polyp was sessile. The polyp was removed with a                            cold snare. Resection and retrieval were complete.                            Estimated blood loss was minimal.                           The exam was otherwise normal throughout the                            examined colon.                           The terminal ileum appeared normal.                           The retroflexed view of the distal rectum and anal                            verge was normal and showed no anal or rectal                            abnormalities. Complications:            No immediate complications. Estimated Blood Loss:     Estimated blood loss was minimal. Impression:               - One 2 mm polyp in the transverse colon, removed                            with a cold snare. Resected and retrieved.                            - The examined portion of the ileum was normal.                           - The distal rectum and anal verge are normal on                            retroflexion view. Recommendation:           - Patient has a contact number available for                            emergencies. The signs and symptoms of potential                            delayed complications were discussed with the                            patient. Return to normal activities tomorrow.  Written discharge instructions were provided to the                            patient.                           - Resume previous diet.                           - Continue present medications.                           - Await pathology results.                           - Repeat colonoscopy (date not yet determined) for                            surveillance based on pathology results. Icker Swigert E. Tomasa Rand, MD 03/10/2023 10:29:16 AM This report has been signed electronically.

## 2023-03-10 NOTE — Progress Notes (Signed)
Called to room to assist during endoscopic procedure.  Patient ID and intended procedure confirmed with present staff. Received instructions for my participation in the procedure from the performing physician.  

## 2023-03-10 NOTE — Progress Notes (Signed)
[facility] History and Physical   Primary Care Physician:  Blane Ohara, MD   Reason for Procedure:   Colon cancer screening  Plan:    Screening colonoscopy     HPI: Chelsea Freeman is a 47 y.o. female undergoing initial average risk screening colonoscopy.  She has no family history of colon cancer and no chronic GI symptoms.    Past Medical History:  Diagnosis Date   Abnormal uterine bleeding (AUB) 06/28/2016   Anxiety disorder    Cellulitis    Dysmenorrhea 06/28/2016   Dysthymic disorder    Fatigue    Hyperlipidemia    Hyperprolactinemia (HCC)    Hypoxemia    Increased frequency of urination 06/28/2016   Intramural leiomyoma of uterus 06/28/2016   Obesity    Prediabetes    Sebaceous cyst    Sleep apnea    Type 2 diabetes mellitus without complications (HCC)    Urge incontinence 06/28/2016    Past Surgical History:  Procedure Laterality Date   ABDOMINAL HYSTERECTOMY  09/03/2019   Total    Prior to Admission medications   Medication Sig Start Date End Date Taking? Authorizing Provider  desvenlafaxine (PRISTIQ) 100 MG 24 hr tablet Take 1 tablet (100 mg total) by mouth daily. 06/01/22  Yes Janie Morning, NP  ezetimibe (ZETIA) 10 MG tablet Take 1 tablet (10 mg total) by mouth daily. 12/26/22  Yes Cox, Kirsten, MD  FARXIGA 10 MG TABS tablet Take 1 tablet (10 mg total) by mouth every morning. 01/26/23  Yes Cox, Kirsten, MD  icosapent Ethyl (VASCEPA) 1 g capsule Take 2 capsules (2 g total) by mouth 2 (two) times daily. 12/26/22  Yes Cox, Kirsten, MD  lisinopril (ZESTRIL) 10 MG tablet Take 1 tablet (10 mg total) by mouth daily. 08/10/22  Yes Janie Morning, NP  metFORMIN (GLUCOPHAGE) 1000 MG tablet Take 1 tablet (1,000 mg total) by mouth 2 (two) times daily with a meal. 04/13/22  Yes Janie Morning, NP  ondansetron (ZOFRAN) 4 MG tablet Take 1 tablet (4 mg total) by mouth every 8 (eight) hours as needed for nausea or vomiting. 04/18/22  Yes Janie Morning, NP   ondansetron (ZOFRAN) 4 MG tablet Take 1 tablet (4 mg total) by mouth as directed. Take one Zofran 4 mg tablet 30-60 minutes before each colonoscopy prep dose 02/24/23  Yes Jenel Lucks, MD  Sodium Sulfate-Mag Sulfate-KCl (SUTAB) [phone] MG TABS Take 24 tablets by mouth as directed. 02/24/23  Yes Jenel Lucks, MD  Vitamin D, Ergocalciferol, (DRISDOL) 1.25 MG (50000 UNIT) CAPS capsule Take 1 capsule (50,000 Units total) by mouth every 7 (seven) days. 01/25/23  Yes Cox, Kirsten, MD  pantoprazole (PROTONIX) 40 MG tablet Take 1 tablet (40 mg total) by mouth daily. 08/10/22   Janie Morning, NP    Current Outpatient Medications  Medication Sig Dispense Refill   desvenlafaxine (PRISTIQ) 100 MG 24 hr tablet Take 1 tablet (100 mg total) by mouth daily. 90 tablet 3   ezetimibe (ZETIA) 10 MG tablet Take 1 tablet (10 mg total) by mouth daily. 30 tablet 2   FARXIGA 10 MG TABS tablet Take 1 tablet (10 mg total) by mouth every morning. 30 tablet 2   icosapent Ethyl (VASCEPA) 1 g capsule Take 2 capsules (2 g total) by mouth 2 (two) times daily. 120 capsule 2   lisinopril (ZESTRIL) 10 MG tablet Take 1 tablet (10 mg total) by mouth daily. 90 tablet 3   metFORMIN (GLUCOPHAGE) 1000  MG tablet Take 1 tablet (1,000 mg total) by mouth 2 (two) times daily with a meal. 180 tablet 3   ondansetron (ZOFRAN) 4 MG tablet Take 1 tablet (4 mg total) by mouth every 8 (eight) hours as needed for nausea or vomiting. 20 tablet 0   ondansetron (ZOFRAN) 4 MG tablet Take 1 tablet (4 mg total) by mouth as directed. Take one Zofran 4 mg tablet 30-60 minutes before each colonoscopy prep dose 2 tablet 0   Sodium Sulfate-Mag Sulfate-KCl (SUTAB) [phone] MG TABS Take 24 tablets by mouth as directed. 24 tablet 0   Vitamin D, Ergocalciferol, (DRISDOL) 1.25 MG (50000 UNIT) CAPS capsule Take 1 capsule (50,000 Units total) by mouth every 7 (seven) days. 4 capsule 2   pantoprazole (PROTONIX) 40 MG tablet Take 1 tablet (40 mg  total) by mouth daily. 30 tablet 3   Current Facility-Administered Medications  Medication Dose Route Frequency Provider Last Rate Last Admin   0.9 %  sodium chloride infusion  500 mL Intravenous Once Jenel Lucks, MD        Allergies as of 03/10/2023 - Review Complete 03/10/2023  Allergen Reaction Noted   Crestor [rosuvastatin]  03/10/2022   Mounjaro [tirzepatide]  12/21/2022   Ozempic (0.25 or 0.5 mg-dose) [semaglutide(0.25 or 0.5mg -dos)]  12/21/2022   Zocor [simvastatin]  03/10/2022    Family History  Problem Relation Age of Onset   Hypertension Mother    Depression Mother    Atrial fibrillation Mother    Hypertension Father    Diabetes Father    Heart Problems Father    Parkinson's disease Father    Atrial fibrillation Sister    Thyroid disease Sister    Cancer Sister        Breast-remission   Heart Problems Sister    Breast cancer Sister    Bipolar disorder Sister    Kidney failure Sister    Cancer Maternal Grandfather        Leukemia   Heart Problems Paternal Grandmother    Heart failure Paternal Grandmother    Stroke Paternal Grandfather    Heart Problems Paternal Grandfather    Colon cancer Neg Hx    Esophageal cancer Neg Hx    Stomach cancer Neg Hx    Colon polyps Neg Hx    Rectal cancer Neg Hx     Social History   Socioeconomic History   Marital status: Married    Spouse name: Kanye Savidge   Number of children: 2   Years of education: Not on file   Highest education level: Not on file  Occupational History   Occupation: ink Audiological scientist   Occupation: administrative asst  Tobacco Use   Smoking status: Never   Smokeless tobacco: Never  Vaping Use   Vaping Use: Never used  Substance and Sexual Activity   Alcohol use: Not Currently   Drug use: Not Currently   Sexual activity: Yes    Partners: Male  Other Topics Concern   Not on file  Social History Narrative   ** Merged History Encounter **       Social Determinants of Health    Financial Resource Strain: Low Risk  (02/10/2022)   Overall Financial Resource Strain (CARDIA)    Difficulty of Paying Living Expenses: Not hard at all  Food Insecurity: No Food Insecurity (02/10/2022)   Hunger Vital Sign    Worried About Running Out of Food in the Last Year: Never true    Ran Out of Food in  the Last Year: Never true  Transportation Needs: No Transportation Needs (02/10/2022)   PRAPARE - Administrator, Civil Service (Medical): No    Lack of Transportation (Non-Medical): No  Physical Activity: Inactive (04/07/2022)   Exercise Vital Sign    Days of Exercise per Week: 0 days    Minutes of Exercise per Session: 0 min  Stress: No Stress Concern Present (02/10/2022)   [employer] of Occupational Health - Occupational Stress Questionnaire    Feeling of Stress : Not at all  Social Connections: Moderately Isolated (02/10/2022)   Social Connection and Isolation Panel [NHANES]    Frequency of Communication with Friends and Family: More than three times a week    Frequency of Social Gatherings with Friends and Family: More than three times a week    Attends Religious Services: Never    Database administrator or Organizations: No    Attends Banker Meetings: Never    Marital Status: Married  Catering manager Violence: Not At Risk (02/10/2022)   Humiliation, Afraid, Rape, and Kick questionnaire    Fear of Current or Ex-Partner: No    Emotionally Abused: No    Physically Abused: No    Sexually Abused: No    Review of Systems:  All other review of systems negative except as mentioned in the HPI.  Physical Exam: Vital signs BP (!) 149/58   Pulse 81   Temp 98.6 F (37 C)   Ht 5\' 2"  (1.575 m)   Wt 217 lb (98.4 kg)   LMP  (LMP Unknown)   SpO2 99%   BMI 39.69 kg/m   General:   Alert,  Well-developed, well-nourished, pleasant and cooperative in NAD Airway:  Mallampati 2 Lungs:  Clear throughout to auscultation.   Heart:  Regular rate and rhythm;  no murmurs, clicks, rubs,  or gallops. Abdomen:  Soft, nontender and nondistended. Normal bowel sounds.   Neuro/Psych:  Normal mood and affect. A and O x 3   Desiray Orchard E. Tomasa Rand, MD [facility]

## 2023-03-10 NOTE — Patient Instructions (Signed)
YOU HAD AN ENDOSCOPIC PROCEDURE TODAY AT THE [facility]:   Refer to the procedure report that was given to you for any specific questions about what was found during the examination.  If the procedure report does not answer your questions, please call your gastroenterologist to clarify.  If you requested that your care partner not be given the details of your procedure findings, then the procedure report has been included in a sealed envelope for you to review at your convenience later.  YOU SHOULD EXPECT: Some feelings of bloating in the abdomen. Passage of more gas than usual.  Walking can help get rid of the air that was put into your GI tract during the procedure and reduce the bloating. If you had a lower endoscopy (such as a colonoscopy or flexible sigmoidoscopy) you may notice spotting of blood in your stool or on the toilet paper. If you underwent a bowel prep for your procedure, you may not have a normal bowel movement for a few days.  Please Note:  You might notice some irritation and congestion in your nose or some drainage.  This is from the oxygen used during your procedure.  There is no need for concern and it should clear up in a day or so.  SYMPTOMS TO REPORT IMMEDIATELY:  Following lower endoscopy (colonoscopy or flexible sigmoidoscopy):  Excessive amounts of blood in the stool  Significant tenderness or worsening of abdominal pains  Swelling of the abdomen that is new, acute  Fever of 100F or higher   For urgent or emergent issues, a gastroenterologist can be reached at any hour by calling [phone]. Do not use MyChart messaging for urgent concerns.    DIET:  We do recommend a small meal at first, but then you may proceed to your regular diet.  Drink plenty of fluids but you should avoid alcoholic beverages for 24 hours.  MEDICATIONS: Continue present medications.  Please see handouts given to you by your recovery nurse: Polyps.  FOLLOW UP: Repeat  colonoscopy for surveillance based on pathology results (date to be determined once pathology results are returned ).  Thank you for allowing [place] to provide for your healthcare needs today.  ACTIVITY:  You should plan to take it easy for the rest of today and you should NOT DRIVE or use heavy machinery until tomorrow (because of the sedation medicines used during the test).    FOLLOW UP: Our staff will call the number listed on your records the next business day following your procedure.  We will call around 7:15- 8:00 am to check on you and address any questions or concerns that you may have regarding the information given to you following your procedure. If we do not reach you, we will leave a message.     If any biopsies were taken you will be contacted by phone or by letter within the next 1-3 weeks.  Please call us at [phone] if you have not heard about the biopsies in 3 weeks.    SIGNATURES/CONFIDENTIALITY: You and/or your care partner have signed paperwork which will be entered into your electronic medical record.  These signatures attest to the fact that that the information above on your After Visit Summary has been reviewed and is understood.  Full responsibility of the confidentiality of this discharge information lies with you and/or your care-partner.

## 2023-03-10 NOTE — Progress Notes (Signed)
Vss nad trans to pacu 

## 2023-03-13 ENCOUNTER — Telehealth: Payer: Self-pay

## 2023-03-13 NOTE — Telephone Encounter (Signed)
  Follow up Call-     03/10/2023    9:00 AM  Call back number  Post procedure Call Back phone  # [phone]  Permission to leave phone message Yes     Patient questions:  Do you have a fever, pain , or abdominal swelling? No. Pain Score  0 *  Have you tolerated food without any problems? Yes.    Have you been able to return to your normal activities? Yes.    Do you have any questions about your discharge instructions: Diet   No. Medications  No. Follow up visit  No.  Do you have questions or concerns about your Care? No.  Actions: * If pain score is 4 or above: No action needed, pain <4.

## 2023-03-14 ENCOUNTER — Other Ambulatory Visit: Payer: Self-pay | Admitting: Family Medicine

## 2023-03-15 NOTE — Progress Notes (Signed)
Ms. Waechter,  The polyp which I removed during your recent procedure was proven to be completely benign but is considered a "pre-cancerous" polyp that MAY have grown into cancer if it had not been removed.  Studies shows that at least 20% of women over age 47 and 30% of men over age 51 have pre-cancerous polyps.  Based on current nationally recognized surveillance guidelines, I recommend that you have a repeat colonoscopy in 7 years.   If you develop any new rectal bleeding, abdominal pain or significant bowel habit changes, please contact me before then.

## 2023-03-29 ENCOUNTER — Telehealth: Payer: Self-pay

## 2023-03-29 ENCOUNTER — Other Ambulatory Visit: Payer: Self-pay

## 2023-03-29 DIAGNOSIS — R932 Abnormal findings on diagnostic imaging of liver and biliary tract: Secondary | ICD-10-CM

## 2023-03-29 DIAGNOSIS — K760 Fatty (change of) liver, not elsewhere classified: Secondary | ICD-10-CM

## 2023-03-29 NOTE — Addendum Note (Signed)
Addended by: Justice Britain on: 03/29/2023 02:28 PM   Modules accepted: Orders

## 2023-03-29 NOTE — Telephone Encounter (Signed)
Left VM for patient letting her know that radiology would call her to schedule repeat MRI and to come to the lab for a repeat AFP.

## 2023-03-31 ENCOUNTER — Ambulatory Visit: Payer: BC Managed Care – PPO | Admitting: Physician Assistant

## 2023-03-31 ENCOUNTER — Ambulatory Visit: Payer: BC Managed Care – PPO | Admitting: Family Medicine

## 2023-03-31 VITALS — BP 108/50 | HR 78 | Temp 96.2°F | Resp 16 | Ht 63.0 in | Wt 219.0 lb

## 2023-03-31 DIAGNOSIS — Z794 Long term (current) use of insulin: Secondary | ICD-10-CM

## 2023-03-31 DIAGNOSIS — R748 Abnormal levels of other serum enzymes: Secondary | ICD-10-CM | POA: Diagnosis not present

## 2023-03-31 DIAGNOSIS — Z7984 Long term (current) use of oral hypoglycemic drugs: Secondary | ICD-10-CM | POA: Diagnosis not present

## 2023-03-31 DIAGNOSIS — E782 Mixed hyperlipidemia: Secondary | ICD-10-CM

## 2023-03-31 DIAGNOSIS — E1169 Type 2 diabetes mellitus with other specified complication: Secondary | ICD-10-CM

## 2023-03-31 NOTE — Progress Notes (Unsigned)
Subjective:  Patient ID: Chelsea Freeman, female    DOB: 31-Aug-1976  Age: 47 y.o. MRN: [id number]  No chief complaint on file.   HPI   Diabetes:  Complications: none  Glucose checking: seldom Glucose logs: none  Hypoglycemia: none  Most recent A1C: 6.8 Current medications: Farxiga 10 mg, metformin 1000 mg twice daily. Last Eye Exam: last summer w/Dr. Dan Humphreys.   Foot checks: daily   Hyperlipidemia: Current medications: Zetia 10 mg daily and Vascepa 1 gram 2 capsules twice daily.    Hypertension: Complications: none  Current medications: Lisinopril 10 mg daily    Diet: eating healthy   Exercise:   Depression:  Pristiq 100 mg daily  Patient Care Team: Blane Ohara, MD as PCP - General (Family Medicine) Sol Passer, Cameron Proud, PA-C as Physician Assistant (Hematology and Oncology)   Review of Systems  Constitutional:  Negative for chills, fatigue and fever.  HENT:  Negative for congestion, rhinorrhea and sore throat.   Respiratory:  Negative for cough and shortness of breath.   Cardiovascular:  Negative for chest pain.  Gastrointestinal:  Negative for abdominal pain, constipation, diarrhea, nausea and vomiting.  Genitourinary:  Negative for dysuria and urgency.  Musculoskeletal:  Negative for back pain and myalgias.  Neurological:  Negative for dizziness, weakness, light-headedness and headaches.  Psychiatric/Behavioral:  Negative for dysphoric mood. The patient is not nervous/anxious.     Current Outpatient Medications on File Prior to Visit  Medication Sig Dispense Refill   desvenlafaxine (PRISTIQ) 100 MG 24 hr tablet Take 1 tablet (100 mg total) by mouth daily. 90 tablet 3   ezetimibe (ZETIA) 10 MG tablet Take 1 tablet (10 mg total) by mouth daily. 30 tablet 2   FARXIGA 10 MG TABS tablet Take 1 tablet (10 mg total) by mouth every morning. 30 tablet 2   icosapent Ethyl (VASCEPA) 1 g capsule Take 2 capsules (2 g total) by mouth 2 (two) times daily. 120 capsule 2    lisinopril (ZESTRIL) 10 MG tablet Take 1 tablet (10 mg total) by mouth daily. 90 tablet 3   metFORMIN (GLUCOPHAGE) 1000 MG tablet Take 1 tablet (1,000 mg total) by mouth 2 (two) times daily with a meal. 180 tablet 3   ondansetron (ZOFRAN) 4 MG tablet Take 1 tablet (4 mg total) by mouth every 8 (eight) hours as needed for nausea or vomiting. 20 tablet 0   ondansetron (ZOFRAN) 4 MG tablet Take 1 tablet (4 mg total) by mouth as directed. Take one Zofran 4 mg tablet 30-60 minutes before each colonoscopy prep dose 2 tablet 0   pantoprazole (PROTONIX) 40 MG tablet Take 1 tablet (40 mg total) by mouth daily. 30 tablet 3   Sodium Sulfate-Mag Sulfate-KCl (SUTAB) [phone] MG TABS Take 24 tablets by mouth as directed. 24 tablet 0   Vitamin D, Ergocalciferol, (DRISDOL) 1.25 MG (50000 UNIT) CAPS capsule Take 1 capsule (50,000 Units total) by mouth every 7 (seven) days. 4 capsule 2   Current Facility-Administered Medications on File Prior to Visit  Medication Dose Route Frequency Provider Last Rate Last Admin   0.9 %  sodium chloride infusion  500 mL Intravenous Once Jenel Lucks, MD       Past Medical History:  Diagnosis Date   Abnormal uterine bleeding (AUB) 06/28/2016   Anxiety disorder    Cellulitis    Dysmenorrhea 06/28/2016   Dysthymic disorder    Fatigue    Hyperlipidemia    Hyperprolactinemia (HCC)    Hypoxemia  Increased frequency of urination 06/28/2016   Intramural leiomyoma of uterus 06/28/2016   Obesity    Prediabetes    Sebaceous cyst    Sleep apnea    Type 2 diabetes mellitus without complications (HCC)    Urge incontinence 06/28/2016   Past Surgical History:  Procedure Laterality Date   ABDOMINAL HYSTERECTOMY  09/03/2019   Total    Family History  Problem Relation Age of Onset   Hypertension Mother    Depression Mother    Atrial fibrillation Mother    Hypertension Father    Diabetes Father    Heart Problems Father    Parkinson's disease Father    Atrial  fibrillation Sister    Thyroid disease Sister    Cancer Sister        Breast-remission   Heart Problems Sister    Breast cancer Sister    Bipolar disorder Sister    Kidney failure Sister    Cancer Maternal Grandfather        Leukemia   Heart Problems Paternal Grandmother    Heart failure Paternal Grandmother    Stroke Paternal Grandfather    Heart Problems Paternal Grandfather    Colon cancer Neg Hx    Esophageal cancer Neg Hx    Stomach cancer Neg Hx    Colon polyps Neg Hx    Rectal cancer Neg Hx    Social History   Socioeconomic History   Marital status: Married    Spouse name: Athenea Snitker   Number of children: 2   Years of education: Not on file   Highest education level: Not on file  Occupational History   Occupation: ink Audiological scientist   Occupation: administrative asst  Tobacco Use   Smoking status: Never   Smokeless tobacco: Never  Vaping Use   Vaping Use: Never used  Substance and Sexual Activity   Alcohol use: Not Currently   Drug use: Not Currently   Sexual activity: Yes    Partners: Male  Other Topics Concern   Not on file  Social History Narrative   ** Merged History Encounter **       Social Determinants of Health   Financial Resource Strain: Low Risk  (02/10/2022)   Overall Financial Resource Strain (CARDIA)    Difficulty of Paying Living Expenses: Not hard at all  Food Insecurity: No Food Insecurity (02/10/2022)   Hunger Vital Sign    Worried About Running Out of Food in the Last Year: Never true    Ran Out of Food in the Last Year: Never true  Transportation Needs: No Transportation Needs (02/10/2022)   PRAPARE - Administrator, Civil Service (Medical): No    Lack of Transportation (Non-Medical): No  Physical Activity: Inactive (04/07/2022)   Exercise Vital Sign    Days of Exercise per Week: 0 days    Minutes of Exercise per Session: 0 min  Stress: No Stress Concern Present (02/10/2022)   [employer] of Occupational Health -  Occupational Stress Questionnaire    Feeling of Stress : Not at all  Social Connections: Moderately Isolated (02/10/2022)   Social Connection and Isolation Panel [NHANES]    Frequency of Communication with Friends and Family: More than three times a week    Frequency of Social Gatherings with Friends and Family: More than three times a week    Attends Religious Services: Never    Database administrator or Organizations: No    Attends Banker Meetings: Never  Marital Status: Married    Objective:  LMP  (LMP Unknown)      03/10/2023   10:47 AM 03/10/2023   10:37 AM 03/10/2023   10:27 AM  BP/Weight  Systolic BP 127 110 115  Diastolic BP 53 61 47    Physical Exam  Diabetic Foot Exam - Simple   No data filed      Lab Results  Component Value Date   WBC 8.6 12/21/2022   HGB 14.5 12/21/2022   HCT 42.9 12/21/2022   PLT 306 12/21/2022   GLUCOSE 118 (H) 12/21/2022   CHOL 236 (H) 12/23/2022   TRIG 193 (H) 12/23/2022   HDL 45 12/23/2022   LDLCALC 156 (H) 12/23/2022   ALT 74 (H) 12/21/2022   AST 32 12/21/2022   NA 142 12/21/2022   K 4.5 12/21/2022   CL 102 12/21/2022   CREATININE 0.73 12/21/2022   BUN 8 12/21/2022   CO2 20 12/21/2022   TSH 0.856 02/10/2022   HGBA1C 6.8 (H) 12/21/2022      Assessment & Plan:    Type 2 diabetes mellitus with complication, with long-term current use of insulin (HCC)     No orders of the defined types were placed in this encounter.   No orders of the defined types were placed in this encounter.    Follow-up: No follow-ups on file.   I,Carolyn M Morrison,acting as a Neurosurgeon for Blane Ohara, MD.,have documented all relevant documentation on the behalf of Blane Ohara, MD,as directed by  Blane Ohara, MD while in the presence of Blane Ohara, MD.   An After Visit Summary was printed and given to the patient.  Blane Ohara, MD [facility] [phone]

## 2023-04-03 ENCOUNTER — Encounter: Payer: Self-pay | Admitting: Family Medicine

## 2023-04-03 NOTE — Assessment & Plan Note (Addendum)
Complicated by obesity and hyperlipidemia. Continue work on [employer] and exercise. Check feet daily. Does not need to check sugars daily. Continue metformin 1000 mg twice daily and farxiga 10 mg daily.  On lisinopril 10 mg daily for renal protection.

## 2023-04-03 NOTE — Assessment & Plan Note (Signed)
Well controlled.  No changes to medicines. Continue zetia 10 mg daily and vascepa 1 gm 2 capsules twice daily.  Continue to work on eating a healthy diet and exercise.  Labs drawn today.

## 2023-04-07 ENCOUNTER — Other Ambulatory Visit: Payer: BC Managed Care – PPO

## 2023-04-07 DIAGNOSIS — E782 Mixed hyperlipidemia: Secondary | ICD-10-CM | POA: Diagnosis not present

## 2023-04-07 DIAGNOSIS — Z794 Long term (current) use of insulin: Secondary | ICD-10-CM | POA: Diagnosis not present

## 2023-04-07 DIAGNOSIS — E118 Type 2 diabetes mellitus with unspecified complications: Secondary | ICD-10-CM | POA: Diagnosis not present

## 2023-04-08 ENCOUNTER — Ambulatory Visit (HOSPITAL_COMMUNITY)
Admission: RE | Admit: 2023-04-08 | Discharge: 2023-04-08 | Disposition: A | Discharge status: Home / Self Care | Payer: BC Managed Care – PPO | Source: Ambulatory Visit | Attending: Gastroenterology | Admitting: Gastroenterology

## 2023-04-08 DIAGNOSIS — R932 Abnormal findings on diagnostic imaging of liver and biliary tract: Secondary | ICD-10-CM | POA: Insufficient documentation

## 2023-04-08 DIAGNOSIS — K760 Fatty (change of) liver, not elsewhere classified: Secondary | ICD-10-CM | POA: Diagnosis not present

## 2023-04-08 DIAGNOSIS — K769 Liver disease, unspecified: Secondary | ICD-10-CM | POA: Diagnosis not present

## 2023-04-08 LAB — CBC WITH DIFFERENTIAL/PLATELET
Basophils Absolute: 0.1 10*3/uL (ref 0.0–0.2)
Basos: 1 %
EOS (ABSOLUTE): 0.2 10*3/uL (ref 0.0–0.4)
Eos: 2 %
Hematocrit: 44.1 % (ref 34.0–46.6)
Hemoglobin: 14.5 g/dL (ref 11.1–15.9)
Immature Grans (Abs): 0.1 10*3/uL (ref 0.0–0.1)
Immature Granulocytes: 1 %
Lymphocytes Absolute: 1.7 10*3/uL (ref 0.7–3.1)
Lymphs: 23 %
MCH: 29.4 pg (ref 26.6–33.0)
MCHC: 32.9 g/dL (ref 31.5–35.7)
MCV: 90 fL (ref 79–97)
Monocytes Absolute: 0.6 10*3/uL (ref 0.1–0.9)
Monocytes: 8 %
Neutrophils Absolute: 4.9 10*3/uL (ref 1.4–7.0)
Neutrophils: 65 %
Platelets: 272 10*3/uL (ref 150–450)
RBC: 4.93 x10E6/uL (ref 3.77–5.28)
RDW: 13 % (ref 11.7–15.4)
WBC: 7.5 10*3/uL (ref 3.4–10.8)

## 2023-04-08 LAB — MICROALBUMIN / CREATININE URINE RATIO
Creatinine, Urine: 90.3 mg/dL
Microalb/Creat Ratio: 8 mg/g creat (ref 0–29)
Microalbumin, Urine: 7.5 ug/mL

## 2023-04-08 LAB — COMPREHENSIVE METABOLIC PANEL
ALT: 45 IU/L — ABNORMAL HIGH (ref 0–32)
AST: 25 IU/L (ref 0–40)
Albumin/Globulin Ratio: 1.6 (ref 1.2–2.2)
Albumin: 4.6 g/dL (ref 3.9–4.9)
Alkaline Phosphatase: 82 IU/L (ref 44–121)
BUN/Creatinine Ratio: 15 (ref 9–23)
BUN: 10 mg/dL (ref 6–24)
Bilirubin Total: 0.3 mg/dL (ref 0.0–1.2)
CO2: 21 mmol/L (ref 20–29)
Calcium: 9.8 mg/dL (ref 8.7–10.2)
Chloride: 98 mmol/L (ref 96–106)
Creatinine, Ser: 0.68 mg/dL (ref 0.57–1.00)
Globulin, Total: 2.9 g/dL (ref 1.5–4.5)
Glucose: 129 mg/dL — ABNORMAL HIGH (ref 70–99)
Potassium: 4.6 mmol/L (ref 3.5–5.2)
Sodium: 139 mmol/L (ref 134–144)
Total Protein: 7.5 g/dL (ref 6.0–8.5)
eGFR: 109 mL/min/{1.73_m2} (ref >59)

## 2023-04-08 LAB — LIPID PANEL
Chol/HDL Ratio: 4 ratio (ref 0.0–4.4)
Cholesterol, Total: 231 mg/dL — ABNORMAL HIGH (ref 100–199)
HDL: 58 mg/dL (ref >39)
LDL Chol Calc (NIH): 143 mg/dL — ABNORMAL HIGH (ref 0–99)
Triglycerides: 167 mg/dL — ABNORMAL HIGH (ref 0–149)
VLDL Cholesterol Cal: 30 mg/dL (ref 5–40)

## 2023-04-08 LAB — CARDIOVASCULAR RISK ASSESSMENT

## 2023-04-08 LAB — HEMOGLOBIN A1C
Est. average glucose Bld gHb Est-mCnc: 148 mg/dL
Hgb A1c MFr Bld: 6.8 % — ABNORMAL HIGH (ref 4.8–5.6)

## 2023-04-08 MED ORDER — GADOXETATE DISODIUM 0.25 MMOL/ML IV SOLN
10.0000 mL | Freq: Once | INTRAVENOUS | Status: AC | PRN
  Administered 2023-04-08: 10 mL via INTRAVENOUS

## 2023-04-10 ENCOUNTER — Other Ambulatory Visit: Payer: Self-pay

## 2023-04-10 ENCOUNTER — Other Ambulatory Visit: Payer: Self-pay | Admitting: Family Medicine

## 2023-04-10 MED ORDER — FENOFIBRATE 160 MG PO TABS: 160.0000 mg | ORAL_TABLET | Freq: Every day | ORAL | 1 refills | Status: DC

## 2023-04-12 ENCOUNTER — Other Ambulatory Visit: Payer: Self-pay | Admitting: Family Medicine

## 2023-04-12 ENCOUNTER — Other Ambulatory Visit: Payer: BC Managed Care – PPO

## 2023-04-12 ENCOUNTER — Ambulatory Visit
Admission: RE | Admit: 2023-04-12 | Discharge: 2023-04-12 | Disposition: A | Discharge status: Home / Self Care | Payer: BC Managed Care – PPO | Source: Ambulatory Visit | Attending: Family Medicine | Admitting: Family Medicine

## 2023-04-12 ENCOUNTER — Other Ambulatory Visit: Payer: Self-pay

## 2023-04-12 DIAGNOSIS — Z1231 Encounter for screening mammogram for malignant neoplasm of breast: Secondary | ICD-10-CM | POA: Diagnosis not present

## 2023-04-12 DIAGNOSIS — R748 Abnormal levels of other serum enzymes: Secondary | ICD-10-CM | POA: Diagnosis not present

## 2023-04-13 LAB — AFP TUMOR MARKER: AFP, Serum, Tumor Marker: 18.7 ng/mL — ABNORMAL HIGH (ref 0.0–6.4)

## 2023-04-19 ENCOUNTER — Other Ambulatory Visit: Payer: Self-pay | Admitting: Family Medicine

## 2023-04-19 DIAGNOSIS — J0100 Acute maxillary sinusitis, unspecified: Secondary | ICD-10-CM | POA: Diagnosis not present

## 2023-04-19 DIAGNOSIS — R928 Other abnormal and inconclusive findings on diagnostic imaging of breast: Secondary | ICD-10-CM

## 2023-04-19 NOTE — Progress Notes (Signed)
Chelsea Freeman,  The larger lesion in your liver appeared stable in size and is most likely an adenoma.  These are benign lesions, but can pose risk of hemorrhage.  Surgery is often considered when an adenoma is >5cm or if it is felt to be a cause of pain.  I recommend continuing surveillance with a repeat MRI in 1 year.  I would like to repeat an AFP in 6 months. With regards to the fatty liver, you are at increased risk of developing more significant liver disease in the future.  Please make concerted efforts towards weight loss through diet and exercise.  Follow up with me in 1 year.

## 2023-05-03 ENCOUNTER — Ambulatory Visit: Payer: BC Managed Care – PPO

## 2023-05-03 ENCOUNTER — Ambulatory Visit
Admission: RE | Admit: 2023-05-03 | Discharge: 2023-05-03 | Disposition: A | Discharge status: Home / Self Care | Payer: BC Managed Care – PPO | Source: Ambulatory Visit | Attending: Family Medicine | Admitting: Family Medicine

## 2023-05-03 DIAGNOSIS — R928 Other abnormal and inconclusive findings on diagnostic imaging of breast: Secondary | ICD-10-CM | POA: Diagnosis not present

## 2023-05-05 DIAGNOSIS — G4733 Obstructive sleep apnea (adult) (pediatric): Secondary | ICD-10-CM | POA: Diagnosis not present

## 2023-05-15 ENCOUNTER — Other Ambulatory Visit: Payer: Self-pay

## 2023-05-15 DIAGNOSIS — E1165 Type 2 diabetes mellitus with hyperglycemia: Secondary | ICD-10-CM

## 2023-05-15 DIAGNOSIS — G4733 Obstructive sleep apnea (adult) (pediatric): Secondary | ICD-10-CM | POA: Diagnosis not present

## 2023-05-15 MED ORDER — METFORMIN HCL 1000 MG PO TABS
1000.0000 mg | ORAL_TABLET | Freq: Two times a day (BID) | ORAL | 1 refills | Status: DC
Start: 2023-05-15 — End: 2023-10-15

## 2023-05-25 ENCOUNTER — Other Ambulatory Visit: Payer: Self-pay

## 2023-05-25 DIAGNOSIS — F341 Dysthymic disorder: Secondary | ICD-10-CM

## 2023-05-25 MED ORDER — DESVENLAFAXINE SUCCINATE ER 100 MG PO TB24
100.0000 mg | ORAL_TABLET | Freq: Every day | ORAL | 3 refills | Status: AC
Start: 2023-05-25 — End: ?

## 2023-06-13 ENCOUNTER — Other Ambulatory Visit: Payer: Self-pay | Admitting: Family Medicine

## 2023-07-09 ENCOUNTER — Other Ambulatory Visit: Payer: Self-pay | Admitting: Family Medicine

## 2023-08-03 DIAGNOSIS — G4733 Obstructive sleep apnea (adult) (pediatric): Secondary | ICD-10-CM | POA: Diagnosis not present

## 2023-08-15 ENCOUNTER — Other Ambulatory Visit: Payer: Self-pay

## 2023-08-15 DIAGNOSIS — I152 Hypertension secondary to endocrine disorders: Secondary | ICD-10-CM

## 2023-08-15 MED ORDER — LISINOPRIL 10 MG PO TABS
10.0000 mg | ORAL_TABLET | Freq: Every day | ORAL | 1 refills | Status: DC
Start: 2023-08-15 — End: 2023-12-24

## 2023-09-08 ENCOUNTER — Other Ambulatory Visit: Payer: Self-pay | Admitting: Family Medicine

## 2023-09-13 DIAGNOSIS — Z23 Encounter for immunization: Secondary | ICD-10-CM | POA: Diagnosis not present

## 2023-09-26 DIAGNOSIS — J0100 Acute maxillary sinusitis, unspecified: Secondary | ICD-10-CM | POA: Diagnosis not present

## 2023-09-26 DIAGNOSIS — J309 Allergic rhinitis, unspecified: Secondary | ICD-10-CM | POA: Diagnosis not present

## 2023-10-02 ENCOUNTER — Ambulatory Visit: Payer: BC Managed Care – PPO | Admitting: Physician Assistant

## 2023-10-02 ENCOUNTER — Encounter: Payer: Self-pay | Admitting: Physician Assistant

## 2023-10-02 VITALS — BP 120/64 | HR 97 | Temp 97.2°F | Resp 12 | Ht 63.0 in | Wt 213.0 lb

## 2023-10-02 DIAGNOSIS — E669 Obesity, unspecified: Secondary | ICD-10-CM | POA: Diagnosis not present

## 2023-10-02 DIAGNOSIS — J320 Chronic maxillary sinusitis: Secondary | ICD-10-CM | POA: Diagnosis not present

## 2023-10-02 DIAGNOSIS — J3089 Other allergic rhinitis: Secondary | ICD-10-CM | POA: Insufficient documentation

## 2023-10-02 LAB — POCT INFLUENZA A/B
Influenza A, POC: NEGATIVE
Influenza B, POC: NEGATIVE

## 2023-10-02 LAB — POC COVID19 BINAXNOW: SARS Coronavirus 2 Ag: NEGATIVE

## 2023-10-02 MED ORDER — CEFDINIR 300 MG PO CAPS
300.0000 mg | ORAL_CAPSULE | Freq: Two times a day (BID) | ORAL | 0 refills | Status: DC
Start: 2023-10-02 — End: 2024-02-28

## 2023-10-02 MED ORDER — TRIAMCINOLONE ACETONIDE 40 MG/ML IJ SUSP
80.0000 mg | Freq: Once | INTRAMUSCULAR | Status: AC
Start: 2023-10-02 — End: 2023-10-02
  Administered 2023-10-02: 80 mg via INTRAMUSCULAR

## 2023-10-02 NOTE — Assessment & Plan Note (Signed)
History of seasonal symptoms, but antihistamines cause excessive dryness and voice loss. -Trial of Children's Benadryl as needed, adjusting dose based on symptoms and side effects.

## 2023-10-02 NOTE — Progress Notes (Signed)
Subjective:  Patient ID: Chelsea Freeman, female    DOB: 1976/08/22  Age: 47 y.o. MRN: [id number]  Chief Complaint  Patient presents with   Sinusitis    HPI   Patient is here for sinus pressure and posnasal drainage since 2 weeks ago. She is taking augmentin and today is 6th day. She still has sinus congestion drainage green and bloody. Discussed the use of AI scribe software for clinical note transcription with the patient, who gave verbal consent to proceed.  History of Present Illness   The patient, a 47 year old female with a history of sleep apnea managed with a CPAP machine, presents with acute sinusitis. She reports severe pressure in her sinuses, to the point where her teeth ache, and has been waking up at night to adjust her CPAP machine for relief. She describes her nasal discharge as initially green and yellow, which lightened to almost clear after starting Augmentin 875mg  every 12 hours a week ago. However, her symptoms worsened yesterday, with the return of the nasal discharge. She denies any sore throat and states that her symptoms are confined to her sinuses.  In addition to her sinusitis, the patient has recently started a weight loss regimen with phentermine and Topamax, which she has been on for three weeks without any adverse effects such as headaches or dizziness. She also mentions a history of adverse reactions to Ozempic and statins, which caused nausea and vomiting.          03/31/2023   11:10 AM 08/10/2022    8:34 AM 06/01/2022    8:43 AM 05/03/2022    9:53 AM 04/07/2022    9:05 AM  Depression screen PHQ 2/9  Decreased Interest 0 0 1 3 3   Down, Depressed, Hopeless 0 0 0 3 3  PHQ - 2 Score 0 0 1 6 6   Altered sleeping 0 0 0 2 1  Tired, decreased energy 0 1 1 3 3   Change in appetite 0 0 0 0 3  Feeling bad or failure about yourself  0 0 0 0 2  Trouble concentrating 0 0 0 0 1  Moving slowly or fidgety/restless 0 0 0 0 0  Suicidal thoughts 0 0 0 0 0  PHQ-9 Score 0  1 2 11 16   Difficult doing work/chores Not difficult at all Not difficult at all Not difficult at all          03/31/2023   11:10 AM  Fall Risk   Falls in the past year? 0  Number falls in past yr: 0  Injury with Fall? 0  Risk for fall due to : No Fall Risks  Follow up Falls evaluation completed;Falls prevention discussed    Patient Care Team: CoxFritzi Mandes, MD as PCP - General (Family Medicine) Darcey Nora as Physician Assistant (Hematology and Oncology)   Review of Systems  Constitutional:  Negative for chills, fatigue and fever.  HENT:  Positive for congestion and sinus pressure. Negative for ear pain and sore throat.   Respiratory:  Negative for cough and shortness of breath.   Cardiovascular:  Negative for chest pain and palpitations.  Gastrointestinal:  Negative for abdominal pain, constipation, diarrhea, nausea and vomiting.  Endocrine: Negative for polydipsia, polyphagia and polyuria.  Genitourinary:  Negative for difficulty urinating and dysuria.  Musculoskeletal:  Negative for arthralgias, back pain and myalgias.  Skin:  Negative for rash.  Neurological:  Negative for headaches.  Psychiatric/Behavioral:  Negative for dysphoric mood. The patient is not  nervous/anxious.     Current Outpatient Medications on File Prior to Visit  Medication Sig Dispense Refill   fenofibrate 160 MG tablet Take 1 tablet (160 mg total) by mouth daily. 90 tablet 1   ipratropium (ATROVENT) 0.03 % nasal spray Place 2 sprays into both nostrils 3 (three) times daily.     phentermine 15 MG capsule Take 15 mg by mouth every morning.     topiramate (TOPAMAX) 50 MG tablet SMARTSIG:1 Tablet(s) By Mouth Every Evening     desvenlafaxine (PRISTIQ) 100 MG 24 hr tablet Take 1 tablet (100 mg total) by mouth daily. 90 tablet 3   ezetimibe (ZETIA) 10 MG tablet TAKE ONE TABLET BY MOUTH DAILY 30 tablet 0   FARXIGA 10 MG TABS tablet Take 1 tablet (10 mg total) by mouth every morning. 90 tablet 1    icosapent Ethyl (VASCEPA) 1 g capsule TAKE 2 CAPSULES BY MOUTH TWICE DAILY **NEED FASTING APPOINTMENT WITH DR COX IN 07/2023** 120 capsule 0   lisinopril (ZESTRIL) 10 MG tablet Take 1 tablet (10 mg total) by mouth daily. 90 tablet 1   metFORMIN (GLUCOPHAGE) 1000 MG tablet Take 1 tablet (1,000 mg total) by mouth 2 (two) times daily with a meal. 180 tablet 1   pravastatin (PRAVACHOL) 40 MG tablet 1 tablet Orally Once a day for 30 day(s)     Sodium Sulfate-Mag Sulfate-KCl (SUTAB) [phone] MG TABS Take 24 tablets by mouth as directed. 24 tablet 0   Vitamin D, Ergocalciferol, (DRISDOL) 1.25 MG (50000 UNIT) CAPS capsule Take 1 capsule (50,000 Units total) by mouth every 7 (seven) days. 12 capsule 3   Current Facility-Administered Medications on File Prior to Visit  Medication Dose Route Frequency Provider Last Rate Last Admin   0.9 %  sodium chloride infusion  500 mL Intravenous Once Jenel Lucks, MD       Past Medical History:  Diagnosis Date   Abnormal uterine bleeding (AUB) 06/28/2016   Anxiety disorder    Cellulitis    Dysmenorrhea 06/28/2016   Dysthymic disorder    Fatigue    Hyperlipidemia    Hyperprolactinemia (HCC)    Hypoxemia    Increased frequency of urination 06/28/2016   Intramural leiomyoma of uterus 06/28/2016   Obesity    Prediabetes    Sebaceous cyst    Sleep apnea    Type 2 diabetes mellitus without complications (HCC)    Urge incontinence 06/28/2016   Past Surgical History:  Procedure Laterality Date   ABDOMINAL HYSTERECTOMY  09/03/2019   Total    Family History  Problem Relation Age of Onset   Hypertension Mother    Depression Mother    Atrial fibrillation Mother    Hypertension Father    Diabetes Father    Heart Problems Father    Parkinson's disease Father    Atrial fibrillation Sister    Thyroid disease Sister    Cancer Sister        Breast-remission   Heart Problems Sister    Breast cancer Sister    Bipolar disorder Sister    Kidney failure  Sister    Cancer Maternal Grandfather        Leukemia   Heart Problems Paternal Grandmother    Heart failure Paternal Grandmother    Stroke Paternal Grandfather    Heart Problems Paternal Grandfather    Colon cancer Neg Hx    Esophageal cancer Neg Hx    Stomach cancer Neg Hx    Colon polyps Neg Hx  Rectal cancer Neg Hx    Social History   Socioeconomic History   Marital status: Married    Spouse name: Everlena Roel   Number of children: 2   Years of education: Not on file   Highest education level: Not on file  Occupational History   Occupation: ink Audiological scientist   Occupation: administrative asst  Tobacco Use   Smoking status: Never   Smokeless tobacco: Never  Vaping Use   Vaping status: Never Used  Substance and Sexual Activity   Alcohol use: Not Currently   Drug use: Not Currently   Sexual activity: Yes    Partners: Male  Other Topics Concern   Not on file  Social History Narrative   ** Merged History Encounter **       Social Determinants of Health   Financial Resource Strain: Low Risk  (02/10/2022)   Overall Financial Resource Strain (CARDIA)    Difficulty of Paying Living Expenses: Not hard at all  Food Insecurity: No Food Insecurity (02/10/2022)   Hunger Vital Sign    Worried About Running Out of Food in the Last Year: Never true    Ran Out of Food in the Last Year: Never true  Transportation Needs: No Transportation Needs (02/10/2022)   PRAPARE - Administrator, Civil Service (Medical): No    Lack of Transportation (Non-Medical): No  Physical Activity: Inactive (04/07/2022)   Exercise Vital Sign    Days of Exercise per Week: 0 days    Minutes of Exercise per Session: 0 min  Stress: No Stress Concern Present (02/10/2022)   [employer] of Occupational Health - Occupational Stress Questionnaire    Feeling of Stress : Not at all  Social Connections: Moderately Isolated (02/10/2022)   Social Connection and Isolation Panel [NHANES]    Frequency of  Communication with Friends and Family: More than three times a week    Frequency of Social Gatherings with Friends and Family: More than three times a week    Attends Religious Services: Never    Database administrator or Organizations: No    Attends Engineer, structural: Never    Marital Status: Married    Objective:  BP 120/64   Pulse 97   Temp (!) 97.2 F (36.2 C)   Resp 12   Ht 5\' 3"  (1.6 m)   Wt 213 lb (96.6 kg)   LMP  (LMP Unknown)   SpO2 98%   BMI 37.73 kg/m      10/02/2023    2:17 PM 03/31/2023   11:01 AM 03/10/2023   10:47 AM  BP/Weight  Systolic BP 120 108 127  Diastolic BP 64 50 53  Wt. (Lbs) 213 219   BMI 37.73 kg/m2 38.79 kg/m2     Physical Exam Vitals reviewed.  Constitutional:      Appearance: Normal appearance.  HENT:     Head: Normocephalic and atraumatic.     Salivary Glands: Right salivary gland is diffusely enlarged. Left salivary gland is diffusely enlarged.     Right Ear: Hearing normal. No decreased hearing noted. No drainage or tenderness. Tympanic membrane is bulging. Tympanic membrane is not injected.     Left Ear: Hearing normal. No decreased hearing noted. No drainage or tenderness. Tympanic membrane is bulging. Tympanic membrane is not injected.     Ears:     Comments: Excessively dry ear canal bilaterally    Mouth/Throat:     Tongue: No lesions. Tongue does not deviate from  midline.     Palate: No mass and lesions.     Pharynx: Uvula midline. Posterior oropharyngeal erythema present.  Cardiovascular:     Rate and Rhythm: Normal rate and regular rhythm.     Heart sounds: Normal heart sounds.  Pulmonary:     Effort: Pulmonary effort is normal.     Breath sounds: Normal breath sounds.  Abdominal:     General: Bowel sounds are normal.     Palpations: Abdomen is soft.     Tenderness: There is no abdominal tenderness.  Neurological:     Mental Status: She is alert and oriented to person, place, and time.  Psychiatric:         Mood and Affect: Mood normal.        Behavior: Behavior normal.     Diabetic Foot Exam - Simple   No data filed      Lab Results  Component Value Date   WBC 7.5 04/07/2023   HGB 14.5 04/07/2023   HCT 44.1 04/07/2023   PLT 272 04/07/2023   GLUCOSE 129 (H) 04/07/2023   CHOL 231 (H) 04/07/2023   TRIG 167 (H) 04/07/2023   HDL 58 04/07/2023   LDLCALC 143 (H) 04/07/2023   ALT 45 (H) 04/07/2023   AST 25 04/07/2023   NA 139 04/07/2023   K 4.6 04/07/2023   CL 98 04/07/2023   CREATININE 0.68 04/07/2023   BUN 10 04/07/2023   CO2 21 04/07/2023   TSH 0.856 02/10/2022   HGBA1C 6.8 (H) 04/07/2023      Assessment & Plan:    Chronic maxillary sinusitis Assessment & Plan: Persistent symptoms despite 7 days of Augmentin 875mg  every 12 hours. No lower respiratory symptoms. -Discontinue Augmentin. -Start Cefdinir (Omnicef) twice daily for 7 days. -Administer Kenalog injection today. -Consider imaging if symptoms persist or recur.  Orders: -     Cefdinir; Take 1 capsule (300 mg total) by mouth 2 (two) times daily.  Dispense: 14 capsule; Refill: 0 -     Triamcinolone Acetonide -     POC COVID-19 BinaxNow -     POCT Influenza A/B  Obesity (BMI 30-39.9) Assessment & Plan: -Continue Phentermine and Topamax as prescribed    Environmental and seasonal allergies Assessment & Plan: History of seasonal symptoms, but antihistamines cause excessive dryness and voice loss. -Trial of Children's Benadryl as needed, adjusting dose based on symptoms and side effects.      Meds ordered this encounter  Medications   cefdinir (OMNICEF) 300 MG capsule    Sig: Take 1 capsule (300 mg total) by mouth 2 (two) times daily.    Dispense:  14 capsule    Refill:  0   triamcinolone acetonide (KENALOG-40) injection 80 mg    Orders Placed This Encounter  Procedures   POC COVID-19   Influenza A/B     Follow-up: No follow-ups on file.   I,Marla I Leal-Borjas,acting as a scribe for [employer], PA.,have documented all relevant documentation on the behalf of Langley Gauss, PA,as directed by  Langley Gauss, PA while in the presence of Langley Gauss, [place].   An After Visit Summary was printed and given to the patient.  Langley Gauss, [place] [facility] [phone]

## 2023-10-02 NOTE — Assessment & Plan Note (Signed)
Persistent symptoms despite 7 days of Augmentin 875mg  every 12 hours. No lower respiratory symptoms. -Discontinue Augmentin. -Start Cefdinir (Omnicef) twice daily for 7 days. -Administer Kenalog injection today. -Consider imaging if symptoms persist or recur.

## 2023-10-02 NOTE — Assessment & Plan Note (Signed)
-  Continue Phentermine and Topamax as prescribed

## 2023-10-07 ENCOUNTER — Other Ambulatory Visit: Payer: Self-pay | Admitting: Family Medicine

## 2023-10-10 ENCOUNTER — Other Ambulatory Visit: Payer: Self-pay | Admitting: Family Medicine

## 2023-10-14 ENCOUNTER — Other Ambulatory Visit: Payer: Self-pay | Admitting: Family Medicine

## 2023-10-14 DIAGNOSIS — E1165 Type 2 diabetes mellitus with hyperglycemia: Secondary | ICD-10-CM

## 2023-10-19 ENCOUNTER — Other Ambulatory Visit: Payer: Self-pay | Admitting: *Deleted

## 2023-10-19 DIAGNOSIS — R932 Abnormal findings on diagnostic imaging of liver and biliary tract: Secondary | ICD-10-CM

## 2023-10-19 DIAGNOSIS — A150 Tuberculosis of lung: Secondary | ICD-10-CM

## 2023-10-23 ENCOUNTER — Other Ambulatory Visit: Payer: Self-pay | Admitting: Family Medicine

## 2023-10-23 ENCOUNTER — Telehealth: Payer: Self-pay | Admitting: *Deleted

## 2023-10-23 NOTE — Telephone Encounter (Signed)
-----   Message from Nurse Kerrie Buffalo sent at 10/19/2023 10:06 AM EST ----- Regarding: FW: Lab  ----- Message ----- From: Chrystie Nose, RN Sent: 10/19/2023  12:00 AM EST To: Chrystie Nose, RN Subject: Lab                                            Pt needs AFP lab, need to enter order

## 2023-10-23 NOTE — Telephone Encounter (Signed)
Order entered for tumor marker. Made contact with patient today, nurse was informed via the patient, her PCP will be doing labs in January and she would prefer that she does the lab and she will send a copy to the [place] [facility] office.

## 2023-10-26 ENCOUNTER — Ambulatory Visit: Payer: BC Managed Care – PPO | Admitting: Family Medicine

## 2023-11-03 DIAGNOSIS — G4733 Obstructive sleep apnea (adult) (pediatric): Secondary | ICD-10-CM | POA: Diagnosis not present

## 2023-11-13 DIAGNOSIS — G4733 Obstructive sleep apnea (adult) (pediatric): Secondary | ICD-10-CM | POA: Diagnosis not present

## 2023-11-21 ENCOUNTER — Other Ambulatory Visit: Payer: Self-pay | Admitting: Family Medicine

## 2023-11-28 ENCOUNTER — Encounter: Payer: Self-pay | Admitting: Family Medicine

## 2023-11-28 ENCOUNTER — Ambulatory Visit: Payer: BC Managed Care – PPO | Admitting: Family Medicine

## 2023-11-28 VITALS — BP 128/74 | HR 101 | Temp 98.5°F | Ht 63.0 in | Wt 207.0 lb

## 2023-11-28 DIAGNOSIS — Z794 Long term (current) use of insulin: Secondary | ICD-10-CM

## 2023-11-28 DIAGNOSIS — E118 Type 2 diabetes mellitus with unspecified complications: Secondary | ICD-10-CM

## 2023-11-28 DIAGNOSIS — N838 Other noninflammatory disorders of ovary, fallopian tube and broad ligament: Secondary | ICD-10-CM | POA: Diagnosis not present

## 2023-11-28 DIAGNOSIS — R160 Hepatomegaly, not elsewhere classified: Secondary | ICD-10-CM

## 2023-11-28 DIAGNOSIS — R932 Abnormal findings on diagnostic imaging of liver and biliary tract: Secondary | ICD-10-CM

## 2023-11-28 DIAGNOSIS — E782 Mixed hyperlipidemia: Secondary | ICD-10-CM | POA: Diagnosis not present

## 2023-11-28 NOTE — Progress Notes (Unsigned)
Subjective:  Patient ID: Chelsea Freeman, female    DOB: [birth year]  Age: 48 y.o. MRN: [id number]  Chief Complaint  Patient presents with   Medical Management of Chronic Issues    HPI   Diabetes:  Complications: none  Glucose checking: seldom Glucose logs: none  Hypoglycemia: none  Most recent A1C: 6.8 Current medications: Farxiga 10 mg, metformin 1000 mg twice daily. On lisinopril 10 mg daily.  Last Eye Exam: last summer w/Dr. Dan Humphreys.   Foot checks: daily   Hyperlipidemia: Current medications: Zetia 10 mg daily and Vascepa 1 gram 2 capsules twice daily, fenofibrate 160 mg daily, pravastatin 40 mg daily.  Weight loss: Taking phentermine 15 mg twice daily and topamax 50 mg daily.   Diet: eating healthy and exercising.  Recurrent major Depression:  Pristiq 100 mg daily,   History of ovarian mass and liver mass. Follows with Dr. Tomasa Rand. Needs AFP level.      11/28/2023    2:36 PM 03/31/2023   11:10 AM 08/10/2022    8:34 AM 06/01/2022    8:43 AM 05/03/2022    9:53 AM  Depression screen PHQ 2/9  Decreased Interest 0 0 0 1 3  Down, Depressed, Hopeless 0 0 0 0 3  PHQ - 2 Score 0 0 0 1 6  Altered sleeping 0 0 0 0 2  Tired, decreased energy 0 0 1 1 3   Change in appetite 0 0 0 0 0  Feeling bad or failure about yourself  0 0 0 0 0  Trouble concentrating 0 0 0 0 0  Moving slowly or fidgety/restless 0 0 0 0 0  Suicidal thoughts 0 0 0 0 0  PHQ-9 Score 0 0 1 2 11   Difficult doing work/chores Not difficult at all Not difficult at all Not difficult at all Not difficult at all         11/28/2023    2:36 PM  Fall Risk   Falls in the past year? 0  Number falls in past yr: 0  Injury with Fall? 0  Risk for fall due to : No Fall Risks  Follow up Falls evaluation completed    Patient Care Team: Blane Ohara, MD as PCP - General (Family Medicine) Adah Perl, PA-C as Physician Assistant (Hematology and Oncology) Birdie Sons, OD (Ophthalmology) Jenel Lucks, MD as Consulting Physician (Gastroenterology)   Review of Systems  Constitutional:  Negative for chills, fatigue and fever.  HENT:  Negative for congestion, ear pain, rhinorrhea and sore throat.   Respiratory:  Negative for cough and shortness of breath.   Cardiovascular:  Negative for chest pain.  Gastrointestinal:  Negative for abdominal pain, constipation, diarrhea, nausea and vomiting.  Genitourinary:  Negative for dysuria and urgency.  Musculoskeletal:  Negative for back pain and myalgias.  Neurological:  Negative for dizziness, weakness, light-headedness and headaches.  Psychiatric/Behavioral:  Negative for dysphoric mood. The patient is not nervous/anxious.     Current Outpatient Medications on File Prior to Visit  Medication Sig Dispense Refill   cefdinir (OMNICEF) 300 MG capsule Take 1 capsule (300 mg total) by mouth 2 (two) times daily. 14 capsule 0   desvenlafaxine (PRISTIQ) 100 MG 24 hr tablet Take 1 tablet (100 mg total) by mouth daily. 90 tablet 3   ezetimibe (ZETIA) 10 MG tablet TAKE ONE TABLET BY MOUTH DAILY 30 tablet 0   FARXIGA 10 MG TABS tablet TAKE ONE TABLET BY MOUTH EVERY MORNING 90 tablet 1  fenofibrate 160 MG tablet Take 1 tablet (160 mg total) by mouth daily. 90 tablet 1   icosapent Ethyl (VASCEPA) 1 g capsule Take 2 capsules (2 g total) by mouth 2 (two) times daily. 120 capsule 0   ipratropium (ATROVENT) 0.03 % nasal spray Place 2 sprays into both nostrils 3 (three) times daily.     lisinopril (ZESTRIL) 10 MG tablet Take 1 tablet (10 mg total) by mouth daily. 90 tablet 1   metFORMIN (GLUCOPHAGE) 1000 MG tablet Take 1 tablet (1,000 mg total) by mouth 2 (two) times daily with a meal. 180 tablet 1   phentermine 15 MG capsule Take 15 mg by mouth every morning.     pravastatin (PRAVACHOL) 40 MG tablet 1 tablet Orally Once a day for 30 day(s)     Sodium Sulfate-Mag Sulfate-KCl (SUTAB) [phone] MG TABS Take 24 tablets by mouth as directed. 24 tablet 0    topiramate (TOPAMAX) 50 MG tablet SMARTSIG:1 Tablet(s) By Mouth Every Evening     Vitamin D, Ergocalciferol, (DRISDOL) 1.25 MG (50000 UNIT) CAPS capsule Take 1 capsule (50,000 Units total) by mouth every 7 (seven) days. 12 capsule 3   Current Facility-Administered Medications on File Prior to Visit  Medication Dose Route Frequency Provider Last Rate Last Admin   0.9 %  sodium chloride infusion  500 mL Intravenous Once Jenel Lucks, MD       Past Medical History:  Diagnosis Date   Abnormal uterine bleeding (AUB) 06/28/2016   Anxiety disorder    Cellulitis    Dysmenorrhea 06/28/2016   Dysthymic disorder    Fatigue    Hyperlipidemia    Hyperprolactinemia (HCC)    Hypoxemia    Increased frequency of urination 06/28/2016   Intramural leiomyoma of uterus 06/28/2016   Obesity    Prediabetes    Sebaceous cyst    Sleep apnea    Type 2 diabetes mellitus without complications (HCC)    Urge incontinence 06/28/2016   Past Surgical History:  Procedure Laterality Date   ABDOMINAL HYSTERECTOMY  09/03/2019   Total    Family History  Problem Relation Age of Onset   Hypertension Mother    Depression Mother    Atrial fibrillation Mother    Hypertension Father    Diabetes Father    Heart Problems Father    Parkinson's disease Father    Atrial fibrillation Sister    Thyroid disease Sister    Cancer Sister        Breast-remission   Heart Problems Sister    Breast cancer Sister    Bipolar disorder Sister    Kidney failure Sister    Cancer Maternal Grandfather        Leukemia   Heart Problems Paternal Grandmother    Heart failure Paternal Grandmother    Stroke Paternal Grandfather    Heart Problems Paternal Grandfather    Colon cancer Neg Hx    Esophageal cancer Neg Hx    Stomach cancer Neg Hx    Colon polyps Neg Hx    Rectal cancer Neg Hx    Social History   Socioeconomic History   Marital status: Married    Spouse name: Anaveah Sangiovanni   Number of children: 2   Years of  education: Not on file   Highest education level: Not on file  Occupational History   Occupation: ink Audiological scientist   Occupation: administrative asst  Tobacco Use   Smoking status: Never   Smokeless tobacco: Never  Vaping Use  Vaping status: Never Used  Substance and Sexual Activity   Alcohol use: Not Currently   Drug use: Not Currently   Sexual activity: Yes    Partners: Male  Other Topics Concern   Not on file  Social History Narrative   ** Merged History Encounter **       Social Drivers of Health   Financial Resource Strain: Low Risk  (02/10/2022)   Overall Financial Resource Strain (CARDIA)    Difficulty of Paying Living Expenses: Not hard at all  Food Insecurity: No Food Insecurity (02/10/2022)   Hunger Vital Sign    Worried About Running Out of Food in the Last Year: Never true    Ran Out of Food in the Last Year: Never true  Transportation Needs: No Transportation Needs (02/10/2022)   PRAPARE - Administrator, Civil Service (Medical): No    Lack of Transportation (Non-Medical): No  Physical Activity: Inactive (04/07/2022)   Exercise Vital Sign    Days of Exercise per Week: 0 days    Minutes of Exercise per Session: 0 min  Stress: No Stress Concern Present (02/10/2022)   [employer] of Occupational Health - Occupational Stress Questionnaire    Feeling of Stress : Not at all  Social Connections: Moderately Isolated (02/10/2022)   Social Connection and Isolation Panel [NHANES]    Frequency of Communication with Friends and Family: More than three times a week    Frequency of Social Gatherings with Friends and Family: More than three times a week    Attends Religious Services: Never    Database administrator or Organizations: No    Attends Engineer, structural: Never    Marital Status: Married    Objective:  BP 128/74   Pulse (!) 101   Temp 98.5 F (36.9 C)   Ht 5\' 3"  (1.6 m)   Wt 207 lb (93.9 kg)   LMP  (LMP Unknown)   SpO2 98%   BMI 36.67  kg/m      11/28/2023    2:35 PM 10/02/2023    2:17 PM 03/31/2023   11:01 AM  BP/Weight  Systolic BP 128 120 108  Diastolic BP 74 64 50  Wt. (Lbs) 207 213 219  BMI 36.67 kg/m2 37.73 kg/m2 38.79 kg/m2    Physical Exam Vitals reviewed.  Constitutional:      Appearance: Normal appearance. She is obese.  Neck:     Vascular: No carotid bruit.  Cardiovascular:     Rate and Rhythm: Normal rate and regular rhythm.     Heart sounds: Normal heart sounds.  Pulmonary:     Effort: Pulmonary effort is normal. No respiratory distress.     Breath sounds: Normal breath sounds.  Abdominal:     General: Abdomen is flat. Bowel sounds are normal.     Palpations: Abdomen is soft.     Tenderness: There is no abdominal tenderness.  Neurological:     Mental Status: She is alert and oriented to person, place, and time.  Psychiatric:        Mood and Affect: Mood normal.        Behavior: Behavior normal.     Diabetic Foot Exam - Simple   No data filed      Lab Results  Component Value Date   WBC 8.9 11/28/2023   HGB 14.5 11/28/2023   HCT 44.7 11/28/2023   PLT 321 11/28/2023   GLUCOSE 103 (H) 11/28/2023   CHOL 251 (H) 11/28/2023  TRIG 240 (H) 11/28/2023   HDL 60 11/28/2023   LDLCALC 148 (H) 11/28/2023   ALT 30 11/28/2023   AST 13 11/28/2023   NA 139 11/28/2023   K 4.7 11/28/2023   CL 102 11/28/2023   CREATININE 0.77 11/28/2023   BUN 16 11/28/2023   CO2 23 11/28/2023   TSH 0.856 02/10/2022   HGBA1C 6.0 (H) 11/28/2023      Assessment & Plan:    Type 2 diabetes mellitus with complication, with long-term current use of insulin (HCC) Assessment & Plan: Complicated by obesity and hyperlipidemia. Continue work on [employer] and exercise. Check feet daily. Does not need to check sugars daily. Continue metformin 1000 mg twice daily and farxiga 10 mg daily.  On lisinopril 10 mg daily for renal protection.  Orders: -     CBC with Differential/Platelet -     Hemoglobin A1c -      Microalbumin / creatinine urine ratio  Mixed hyperlipidemia Assessment & Plan: Await labs/testing for assessment and recommendations. Continue zetia 10 mg daily and vascepa 1 gm 2 capsules twice daily.  Continue to work on eating a healthy diet and exercise.  Labs drawn today.    Orders: -     Comprehensive metabolic panel -     Lipid panel  Ovarian mass, right Assessment & Plan: CHECK afp   Liver mass Assessment & Plan: Check AFP.    Abnormal finding on imaging of liver -     AFP tumor marker   Don't forget to look up prostate genetic screening.  No orders of the defined types were placed in this encounter.   Orders Placed This Encounter  Procedures   CBC with Differential/Platelet   Comprehensive metabolic panel   Lipid panel   Hemoglobin A1c   Microalbumin / creatinine urine ratio     Follow-up: Return in about 3 months (around 02/26/2024) for chronic fasting.   I,Marla I Leal-Borjas,acting as a scribe for Blane Ohara, MD.,have documented all relevant documentation on the behalf of Blane Ohara, MD,as directed by  Blane Ohara, MD while in the presence of Blane Ohara, MD.   An After Visit Summary was printed and given to the patient.  Blane Ohara, MD [facility] [phone]

## 2023-11-29 LAB — CBC WITH DIFFERENTIAL/PLATELET
Basophils Absolute: 0 10*3/uL (ref 0.0–0.2)
Basos: 0 %
EOS (ABSOLUTE): 0.1 10*3/uL (ref 0.0–0.4)
Eos: 1 %
Hematocrit: 44.7 % (ref 34.0–46.6)
Hemoglobin: 14.5 g/dL (ref 11.1–15.9)
Immature Grans (Abs): 0 10*3/uL (ref 0.0–0.1)
Immature Granulocytes: 0 %
Lymphocytes Absolute: 1.8 10*3/uL (ref 0.7–3.1)
Lymphs: 20 %
MCH: 29.4 pg (ref 26.6–33.0)
MCHC: 32.4 g/dL (ref 31.5–35.7)
MCV: 91 fL (ref 79–97)
Monocytes Absolute: 0.8 10*3/uL (ref 0.1–0.9)
Monocytes: 9 %
Neutrophils Absolute: 6.1 10*3/uL (ref 1.4–7.0)
Neutrophils: 70 %
Platelets: 321 10*3/uL (ref 150–450)
RBC: 4.93 x10E6/uL (ref 3.77–5.28)
RDW: 12.4 % (ref 11.7–15.4)
WBC: 8.9 10*3/uL (ref 3.4–10.8)

## 2023-11-29 LAB — COMPREHENSIVE METABOLIC PANEL
ALT: 30 [IU]/L (ref 0–32)
AST: 13 [IU]/L (ref 0–40)
Albumin: 4.9 g/dL (ref 3.9–4.9)
Alkaline Phosphatase: 69 [IU]/L (ref 44–121)
BUN/Creatinine Ratio: 21 (ref 9–23)
BUN: 16 mg/dL (ref 6–24)
Bilirubin Total: 0.2 mg/dL (ref 0.0–1.2)
CO2: 23 mmol/L (ref 20–29)
Calcium: 10.4 mg/dL — ABNORMAL HIGH (ref 8.7–10.2)
Chloride: 102 mmol/L (ref 96–106)
Creatinine, Ser: 0.77 mg/dL (ref 0.57–1.00)
Globulin, Total: 2.9 g/dL (ref 1.5–4.5)
Glucose: 103 mg/dL — ABNORMAL HIGH (ref 70–99)
Potassium: 4.7 mmol/L (ref 3.5–5.2)
Sodium: 139 mmol/L (ref 134–144)
Total Protein: 7.8 g/dL (ref 6.0–8.5)
eGFR: 96 mL/min/{1.73_m2} (ref >59)

## 2023-11-29 LAB — LIPID PANEL
Chol/HDL Ratio: 4.2 {ratio} (ref 0.0–4.4)
Cholesterol, Total: 251 mg/dL — ABNORMAL HIGH (ref 100–199)
HDL: 60 mg/dL (ref >39)
LDL Chol Calc (NIH): 148 mg/dL — ABNORMAL HIGH (ref 0–99)
Triglycerides: 240 mg/dL — ABNORMAL HIGH (ref 0–149)
VLDL Cholesterol Cal: 43 mg/dL — ABNORMAL HIGH (ref 5–40)

## 2023-11-29 LAB — HEMOGLOBIN A1C
Est. average glucose Bld gHb Est-mCnc: 126 mg/dL
Hgb A1c MFr Bld: 6 % — ABNORMAL HIGH (ref 4.8–5.6)

## 2023-11-29 LAB — MICROALBUMIN / CREATININE URINE RATIO
Creatinine, Urine: 82.8 mg/dL
Microalb/Creat Ratio: 4 mg/g{creat} (ref 0–29)
Microalbumin, Urine: 3.2 ug/mL

## 2023-11-29 NOTE — Assessment & Plan Note (Signed)
Complicated by obesity and hyperlipidemia. Continue work on [employer] and exercise. Check feet daily. Does not need to check sugars daily. Continue metformin 1000 mg twice daily and farxiga 10 mg daily.  On lisinopril 10 mg daily for renal protection.

## 2023-11-29 NOTE — Assessment & Plan Note (Signed)
 Check AFP

## 2023-11-29 NOTE — Assessment & Plan Note (Signed)
CHECK afp

## 2023-11-29 NOTE — Assessment & Plan Note (Addendum)
Await labs/testing for assessment and recommendations. Continue zetia 10 mg daily and vascepa 1 gm 2 capsules twice daily.  Continue to work on eating a healthy diet and exercise.  Labs drawn today.

## 2023-12-12 ENCOUNTER — Other Ambulatory Visit: Payer: Self-pay

## 2023-12-12 ENCOUNTER — Other Ambulatory Visit: Payer: Self-pay | Admitting: Family Medicine

## 2023-12-12 DIAGNOSIS — R160 Hepatomegaly, not elsewhere classified: Secondary | ICD-10-CM

## 2023-12-12 DIAGNOSIS — R932 Abnormal findings on diagnostic imaging of liver and biliary tract: Secondary | ICD-10-CM

## 2023-12-13 ENCOUNTER — Other Ambulatory Visit: Payer: BC Managed Care – PPO

## 2023-12-21 ENCOUNTER — Other Ambulatory Visit: Payer: Self-pay | Admitting: Medical Genetics

## 2023-12-22 ENCOUNTER — Other Ambulatory Visit: Payer: Self-pay | Admitting: Family Medicine

## 2023-12-22 DIAGNOSIS — I152 Hypertension secondary to endocrine disorders: Secondary | ICD-10-CM

## 2024-02-01 DIAGNOSIS — G4733 Obstructive sleep apnea (adult) (pediatric): Secondary | ICD-10-CM | POA: Diagnosis not present

## 2024-02-20 ENCOUNTER — Other Ambulatory Visit: Payer: Self-pay | Admitting: Family Medicine

## 2024-02-20 DIAGNOSIS — E1165 Type 2 diabetes mellitus with hyperglycemia: Secondary | ICD-10-CM

## 2024-02-20 MED ORDER — DAPAGLIFLOZIN PROPANEDIOL 10 MG PO TABS: 10.0000 mg | ORAL_TABLET | Freq: Every morning | ORAL | 1 refills | Status: DC

## 2024-02-20 NOTE — Telephone Encounter (Signed)
 Per Dr. Reinhold Carbine ok to change to generic  Copied from CRM [id number]. Topic: Clinical - Prescription Issue >> Feb 20, 2024  3:06 PM Chelsea Freeman J wrote: Reason for CRM: [facility] called in because the they received an order for FARXIGA 10 MG TABS tablet, and the instructions stated to not fill using generic brand. The pharmacy says that the medication is on backorder and they only have generic brand. They want to know if it's okay to fill using generic brand? If so, they will need a verbal order to do so.  Callback # [phone], Brian Campanile

## 2024-02-27 ENCOUNTER — Encounter: Payer: Self-pay | Admitting: Family Medicine

## 2024-02-27 NOTE — Progress Notes (Unsigned)
 Subjective:  Patient ID: Chelsea Freeman, female    DOB: [birth year]  Age: 48 y.o. MRN: [id number]  Chief Complaint  Patient presents with   Medical Management of Chronic Issues    HPI:  Diabetes:  Complications: none  Glucose checking: seldom Glucose logs: none  Hypoglycemia: none  Most recent A1C: 6.0% Current medications: Farxiga  10 mg, metformin  1000 mg twice daily. On lisinopril  10 mg daily.  Last Eye Exam: last summer w/Dr. Otho Blitz.   Foot checks: daily   Hyperlipidemia: Current medications: Zetia  10 mg daily and Vascepa  1 gram 2 capsules twice daily, fenofibrate  160 mg daily, pravastatin 40 mg daily.  Weight loss: Taking phentermine 15 mg twice daily and topamax 50 mg daily.   Diet: eating healthy and exercising.  Recurrent major Depression:  Pristiq  100 mg daily,   History of ovarian mass and liver mass. Follows with Dr. Cherryl Corona. Needs AFP level.      02/28/2024    9:29 AM 11/28/2023    2:36 PM 03/31/2023   11:10 AM 08/10/2022    8:34 AM 06/01/2022    8:43 AM  Depression screen PHQ 2/9  Decreased Interest 0 0 0 0 1  Down, Depressed, Hopeless 0 0 0 0 0  PHQ - 2 Score 0 0 0 0 1  Altered sleeping 0 0 0 0 0  Tired, decreased energy 0 0 0 1 1  Change in appetite 0 0 0 0 0  Feeling bad or failure about yourself  0 0 0 0 0  Trouble concentrating 0 0 0 0 0  Moving slowly or fidgety/restless 0 0 0 0 0  Suicidal thoughts 0 0 0 0 0  PHQ-9 Score 0 0 0 1 2  Difficult doing work/chores Not difficult at all Not difficult at all Not difficult at all Not difficult at all Not difficult at all        11/28/2023    2:36 PM  Fall Risk   Falls in the past year? 0  Number falls in past yr: 0  Injury with Fall? 0  Risk for fall due to : No Fall Risks  Follow up Falls evaluation completed    Patient Care Team: Mercy Stall, MD as PCP - General (Family Medicine) Alfonso Ike, PA-C as Physician Assistant (Hematology and Oncology) Barbera Books, OD  (Ophthalmology) Elois Hair, MD as Consulting Physician (Gastroenterology)   Review of Systems  Constitutional:  Negative for chills, fatigue and fever.  HENT:  Negative for congestion, ear pain and sore throat.   Respiratory:  Negative for cough and shortness of breath.   Cardiovascular:  Negative for chest pain.  Gastrointestinal:  Negative for abdominal pain, constipation, diarrhea, nausea and vomiting.  Genitourinary:  Negative for dysuria and urgency.  Musculoskeletal:  Negative for arthralgias and myalgias.  Skin:  Negative for rash.  Neurological:  Negative for dizziness and headaches.  Psychiatric/Behavioral:  Negative for dysphoric mood. The patient is not nervous/anxious.     Current Outpatient Medications on File Prior to Visit  Medication Sig Dispense Refill   dapagliflozin  propanediol (FARXIGA ) 10 MG TABS tablet Take 1 tablet (10 mg total) by mouth every morning. 90 tablet 1   desvenlafaxine  (PRISTIQ ) 100 MG 24 hr tablet Take 1 tablet (100 mg total) by mouth daily. 90 tablet 3   ezetimibe  (ZETIA ) 10 MG tablet TAKE ONE TABLET BY MOUTH DAILY 30 tablet 2   fenofibrate  160 MG tablet Take 1 tablet (160 mg total) by mouth daily.  90 tablet 1   icosapent  Ethyl (VASCEPA ) 1 g capsule Take 2 capsules (2 g total) by mouth 2 (two) times daily. 120 capsule 2   ipratropium (ATROVENT) 0.03 % nasal spray Place 2 sprays into both nostrils 3 (three) times daily.     lisinopril  (ZESTRIL ) 10 MG tablet Take 1 tablet (10 mg total) by mouth daily. 90 tablet 1   metFORMIN  (GLUCOPHAGE ) 1000 MG tablet Take 1 tablet (1,000 mg total) by mouth 2 (two) times daily with a meal. 180 tablet 2   phentermine 15 MG capsule Take 15 mg by mouth every morning.     pravastatin (PRAVACHOL) 40 MG tablet 1 tablet Orally Once a day for 30 day(s)     Sodium Sulfate-Mag Sulfate-KCl (SUTAB ) [phone] MG TABS Take 24 tablets by mouth as directed. 24 tablet 0   topiramate (TOPAMAX) 50 MG tablet SMARTSIG:1  Tablet(s) By Mouth Every Evening     Vitamin D , Ergocalciferol , (DRISDOL ) 1.25 MG (50000 UNIT) CAPS capsule Take 1 capsule (50,000 Units total) by mouth every 7 (seven) days. 12 capsule 3   Current Facility-Administered Medications on File Prior to Visit  Medication Dose Route Frequency Provider Last Rate Last Admin   0.9 %  sodium chloride  infusion  500 mL Intravenous Once Elois Hair, MD       Past Medical History:  Diagnosis Date   Abnormal uterine bleeding (AUB) 06/28/2016   Anxiety disorder    Cellulitis    Dysmenorrhea 06/28/2016   Dysthymic disorder    Fatigue    Hyperlipidemia    Hyperprolactinemia (HCC)    Hypoxemia    Increased frequency of urination 06/28/2016   Intramural leiomyoma of uterus 06/28/2016   Obesity    Prediabetes    Sebaceous cyst    Sleep apnea    Type 2 diabetes mellitus without complications (HCC)    Urge incontinence 06/28/2016   Past Surgical History:  Procedure Laterality Date   ABDOMINAL HYSTERECTOMY  09/03/2019   Total    Family History  Problem Relation Age of Onset   Hypertension Mother    Depression Mother    Atrial fibrillation Mother    Hypertension Father    Diabetes Father    Heart Problems Father    Parkinson's disease Father    Atrial fibrillation Sister    Thyroid disease Sister    Cancer Sister        Breast-remission   Heart Problems Sister    Breast cancer Sister    Bipolar disorder Sister    Kidney failure Sister    Cancer Maternal Grandfather        Leukemia   Heart Problems Paternal Grandmother    Heart failure Paternal Grandmother    Stroke Paternal Grandfather    Heart Problems Paternal Grandfather    Colon cancer Neg Hx    Esophageal cancer Neg Hx    Stomach cancer Neg Hx    Colon polyps Neg Hx    Rectal cancer Neg Hx    Social History   Socioeconomic History   Marital status: Married    Spouse name: Neira Bentsen   Number of children: 2   Years of education: Not on file   Highest education  level: Not on file  Occupational History   Occupation: ink Audiological scientist   Occupation: administrative asst  Tobacco Use   Smoking status: Never   Smokeless tobacco: Never  Vaping Use   Vaping status: Never Used  Substance and Sexual Activity   Alcohol  use: Not Currently   Drug use: Not Currently   Sexual activity: Yes    Partners: Male  Other Topics Concern   Not on file  Social History Narrative   ** Merged History Encounter **       Social Drivers of Health   Financial Resource Strain: Low Risk  (02/28/2024)   Overall Financial Resource Strain (CARDIA)    Difficulty of Paying Living Expenses: Not hard at all  Food Insecurity: No Food Insecurity (02/28/2024)   Hunger Vital Sign    Worried About Running Out of Food in the Last Year: Never true    Ran Out of Food in the Last Year: Never true  Transportation Needs: No Transportation Needs (02/28/2024)   PRAPARE - Administrator, Civil Service (Medical): No    Lack of Transportation (Non-Medical): No  Physical Activity: Inactive (02/28/2024)   Exercise Vital Sign    Days of Exercise per Week: 0 days    Minutes of Exercise per Session: 0 min  Stress: No Stress Concern Present (02/28/2024)   [employer] of Occupational Health - Occupational Stress Questionnaire    Feeling of Stress : Not at all  Social Connections: Moderately Isolated (02/28/2024)   Social Connection and Isolation Panel [NHANES]    Frequency of Communication with Friends and Family: More than three times a week    Frequency of Social Gatherings with Friends and Family: More than three times a week    Attends Religious Services: Never    Database administrator or Organizations: No    Attends Engineer, structural: Never    Marital Status: Married    Objective:  BP 136/64   Pulse 78   Temp 97.8 F (36.6 C)   Ht 5\' 3"  (1.6 m)   Wt 200 lb (90.7 kg)   LMP  (LMP Unknown)   SpO2 98%   BMI 35.43 kg/m      02/28/2024    9:26 AM  11/28/2023    2:35 PM 10/02/2023    2:17 PM  BP/Weight  Systolic BP 136 128 120  Diastolic BP 64 74 64  Wt. (Lbs) 200 207 213  BMI 35.43 kg/m2 36.67 kg/m2 37.73 kg/m2    Physical Exam Vitals reviewed.  Constitutional:      Appearance: Normal appearance. She is normal weight.  Neck:     Vascular: No carotid bruit.  Cardiovascular:     Rate and Rhythm: Normal rate and regular rhythm.     Heart sounds: Normal heart sounds.  Pulmonary:     Effort: Pulmonary effort is normal. No respiratory distress.     Breath sounds: Normal breath sounds.  Abdominal:     General: Abdomen is flat. Bowel sounds are normal.     Palpations: Abdomen is soft.     Tenderness: There is no abdominal tenderness.  Neurological:     Mental Status: She is alert and oriented to person, place, and time.  Psychiatric:        Mood and Affect: Mood normal.        Behavior: Behavior normal.     Diabetic Foot Exam - Simple   Simple Foot Form Diabetic Foot exam was performed with the following findings: Yes 02/28/2024  9:16 PM  Visual Inspection No deformities, no ulcerations, no other skin breakdown bilaterally: Yes Sensation Testing Intact to touch and monofilament testing bilaterally: Yes Pulse Check Posterior Tibialis and Dorsalis pulse intact bilaterally: Yes Comments      Lab  Results  Component Value Date   WBC 8.9 11/28/2023   HGB 14.5 11/28/2023   HCT 44.7 11/28/2023   PLT 321 11/28/2023   GLUCOSE 103 (H) 11/28/2023   CHOL 251 (H) 11/28/2023   TRIG 240 (H) 11/28/2023   HDL 60 11/28/2023   LDLCALC 148 (H) 11/28/2023   ALT 30 11/28/2023   AST 13 11/28/2023   NA 139 11/28/2023   K 4.7 11/28/2023   CL 102 11/28/2023   CREATININE 0.77 11/28/2023   BUN 16 11/28/2023   CO2 23 11/28/2023   TSH 0.856 02/10/2022   HGBA1C 6.0 (H) 11/28/2023      Assessment & Plan:  Diabetes mellitus without complication (HCC) Assessment & Plan: Complicated by obesity and hyperlipidemia. Continue work on  [employer] and exercise. Check feet daily. Does not need to check sugars daily. Continue metformin  1000 mg twice daily and farxiga  10 mg daily.  On lisinopril  10 mg daily for renal protection. Check labs  Orders: -     Hemoglobin A1c -     Microalbumin / creatinine urine ratio  Morbidly obese (HCC) Assessment & Plan: COMORBIDITIES: DIABETES Recommend continue to work on eating healthy diet and exercise. CONTINUE TOPAMAX AND PHENTERMINE.   Mixed hyperlipidemia Assessment & Plan: Await labs/testing for assessment and recommendations. Continue zetia  10 mg daily and vascepa  1 gm 2 capsules twice daily.  Continue to work on eating a healthy diet and exercise.  Labs drawn today.    Orders: -     CBC with Differential/Platelet -     Comprehensive metabolic panel with GFR -     Lipid panel  Liver lesion, right lobe Assessment & Plan: Check LFT, CBC, AFP.  Orders: -     AFP tumor marker  Depression, major, recurrent, mild (HCC) Assessment & Plan: Well controlled.  No changes to medicines. Continue Pristiq  100 mg daily. Continue to work on eating a healthy diet and exercise.      Encounter for screening mammogram for malignant neoplasm of breast -     3D Screening Mammogram, Left and Right; Future  Encounter for immunization -     Pneumococcal conjugate vaccine 20-valent     No orders of the defined types were placed in this encounter.   Orders Placed This Encounter  Procedures   MM 3D SCREENING MAMMOGRAM BILATERAL BREAST   Pneumococcal conjugate vaccine 20-valent   CBC with Differential/Platelet   Comprehensive metabolic panel with GFR   Hemoglobin A1c   Lipid panel   Microalbumin / creatinine urine ratio   AFP tumor marker     Follow-up: Return in about 3 months (around 05/29/2024).   I,Marla I Leal-Borjas,acting as a scribe for Mercy Stall, MD.,have documented all relevant documentation on the behalf of Mercy Stall, MD,as directed by  Mercy Stall, MD  while in the presence of Mercy Stall, MD.   An After Visit Summary was printed and given to the patient.  I attest that I have reviewed this visit and agree with the plan scribed by my staff.   Mercy Stall, MD [facility] [phone]

## 2024-02-28 ENCOUNTER — Ambulatory Visit: Payer: BC Managed Care – PPO | Admitting: Family Medicine

## 2024-02-28 VITALS — BP 136/64 | HR 78 | Temp 97.8°F | Ht 63.0 in | Wt 200.0 lb

## 2024-02-28 DIAGNOSIS — F411 Generalized anxiety disorder: Secondary | ICD-10-CM

## 2024-02-28 DIAGNOSIS — K769 Liver disease, unspecified: Secondary | ICD-10-CM | POA: Diagnosis not present

## 2024-02-28 DIAGNOSIS — Z794 Long term (current) use of insulin: Secondary | ICD-10-CM | POA: Diagnosis not present

## 2024-02-28 DIAGNOSIS — E119 Type 2 diabetes mellitus without complications: Secondary | ICD-10-CM | POA: Diagnosis not present

## 2024-02-28 DIAGNOSIS — F330 Major depressive disorder, recurrent, mild: Secondary | ICD-10-CM

## 2024-02-28 DIAGNOSIS — E782 Mixed hyperlipidemia: Secondary | ICD-10-CM | POA: Diagnosis not present

## 2024-02-28 DIAGNOSIS — Z23 Encounter for immunization: Secondary | ICD-10-CM | POA: Insufficient documentation

## 2024-02-28 DIAGNOSIS — Z1231 Encounter for screening mammogram for malignant neoplasm of breast: Secondary | ICD-10-CM | POA: Insufficient documentation

## 2024-02-28 NOTE — Assessment & Plan Note (Signed)
 Complicated by obesity and hyperlipidemia. Continue work on [employer] and exercise. Check feet daily. Does not need to check sugars daily. Continue metformin  1000 mg twice daily and farxiga  10 mg daily.  On lisinopril  10 mg daily for renal protection. Check labs

## 2024-02-28 NOTE — Assessment & Plan Note (Signed)
 COMORBIDITIES: DIABETES Recommend continue to work on eating healthy diet and exercise. CONTINUE TOPAMAX AND PHENTERMINE.

## 2024-02-28 NOTE — Assessment & Plan Note (Signed)
 Check LFT, CBC, AFP.

## 2024-02-28 NOTE — Assessment & Plan Note (Signed)
 Await labs/testing for assessment and recommendations. Continue zetia 10 mg daily and vascepa 1 gm 2 capsules twice daily.  Continue to work on eating a healthy diet and exercise.  Labs drawn today.

## 2024-02-28 NOTE — Assessment & Plan Note (Addendum)
 Well controlled.  No changes to medicines. Continue Pristiq  100 mg daily. Continue to work on eating a healthy diet and exercise.

## 2024-02-29 ENCOUNTER — Encounter: Payer: Self-pay | Admitting: Family Medicine

## 2024-02-29 LAB — COMPREHENSIVE METABOLIC PANEL WITH GFR
ALT: 25 IU/L (ref 0–32)
AST: 10 IU/L (ref 0–40)
Albumin: 4.8 g/dL (ref 3.9–4.9)
Alkaline Phosphatase: 84 IU/L (ref 44–121)
BUN/Creatinine Ratio: 14 (ref 9–23)
BUN: 11 mg/dL (ref 6–24)
Bilirubin Total: 0.4 mg/dL (ref 0.0–1.2)
CO2: 23 mmol/L (ref 20–29)
Calcium: 10.4 mg/dL — ABNORMAL HIGH (ref 8.7–10.2)
Chloride: 106 mmol/L (ref 96–106)
Creatinine, Ser: 0.8 mg/dL (ref 0.57–1.00)
Globulin, Total: 3 g/dL (ref 1.5–4.5)
Glucose: 104 mg/dL — ABNORMAL HIGH (ref 70–99)
Potassium: 5.4 mmol/L — ABNORMAL HIGH (ref 3.5–5.2)
Sodium: 144 mmol/L (ref 134–144)
Total Protein: 7.8 g/dL (ref 6.0–8.5)
eGFR: 91 mL/min/{1.73_m2} (ref >59)

## 2024-02-29 LAB — LIPID PANEL
Chol/HDL Ratio: 4.2 ratio (ref 0.0–4.4)
Cholesterol, Total: 235 mg/dL — ABNORMAL HIGH (ref 100–199)
HDL: 56 mg/dL (ref >39)
LDL Chol Calc (NIH): 154 mg/dL — ABNORMAL HIGH (ref 0–99)
Triglycerides: 139 mg/dL (ref 0–149)
VLDL Cholesterol Cal: 25 mg/dL (ref 5–40)

## 2024-02-29 LAB — CBC WITH DIFFERENTIAL/PLATELET
Basophils Absolute: 0.1 10*3/uL (ref 0.0–0.2)
Basos: 1 %
EOS (ABSOLUTE): 0.1 10*3/uL (ref 0.0–0.4)
Eos: 2 %
Hematocrit: 47.2 % — ABNORMAL HIGH (ref 34.0–46.6)
Hemoglobin: 15.3 g/dL (ref 11.1–15.9)
Immature Grans (Abs): 0 10*3/uL (ref 0.0–0.1)
Immature Granulocytes: 0 %
Lymphocytes Absolute: 1.9 10*3/uL (ref 0.7–3.1)
Lymphs: 28 %
MCH: 29.3 pg (ref 26.6–33.0)
MCHC: 32.4 g/dL (ref 31.5–35.7)
MCV: 90 fL (ref 79–97)
Monocytes Absolute: 0.5 10*3/uL (ref 0.1–0.9)
Monocytes: 7 %
Neutrophils Absolute: 4.3 10*3/uL (ref 1.4–7.0)
Neutrophils: 62 %
Platelets: 284 10*3/uL (ref 150–450)
RBC: 5.22 x10E6/uL (ref 3.77–5.28)
RDW: 12.1 % (ref 11.7–15.4)
WBC: 6.9 10*3/uL (ref 3.4–10.8)

## 2024-02-29 LAB — HEMOGLOBIN A1C
Est. average glucose Bld gHb Est-mCnc: 131 mg/dL
Hgb A1c MFr Bld: 6.2 % — ABNORMAL HIGH (ref 4.8–5.6)

## 2024-02-29 LAB — MICROALBUMIN / CREATININE URINE RATIO
Creatinine, Urine: 138.7 mg/dL
Microalb/Creat Ratio: 6 mg/g{creat} (ref 0–29)
Microalbumin, Urine: 8.4 ug/mL

## 2024-02-29 LAB — AFP TUMOR MARKER: AFP, Serum, Tumor Marker: 18.9 ng/mL — ABNORMAL HIGH (ref 0.0–6.4)

## 2024-03-04 ENCOUNTER — Telehealth: Payer: Self-pay

## 2024-03-04 ENCOUNTER — Other Ambulatory Visit: Payer: Self-pay

## 2024-03-04 DIAGNOSIS — E782 Mixed hyperlipidemia: Secondary | ICD-10-CM

## 2024-03-04 MED ORDER — REPATHA SURECLICK 140 MG/ML ~~LOC~~ SOAJ
140.0000 mg | SUBCUTANEOUS | 2 refills | Status: DC
Start: 2024-03-04 — End: 2024-03-25

## 2024-03-04 NOTE — Telephone Encounter (Signed)
 PA submitted and approved via covermymeds for repatha.

## 2024-03-06 ENCOUNTER — Other Ambulatory Visit: Payer: Self-pay | Admitting: Family Medicine

## 2024-03-09 ENCOUNTER — Other Ambulatory Visit: Payer: Self-pay | Admitting: Family Medicine

## 2024-03-22 ENCOUNTER — Ambulatory Visit: Payer: Self-pay | Admitting: Family Medicine

## 2024-03-22 NOTE — Telephone Encounter (Signed)
 Copied from CRM [id number]. Topic: Clinical - Medication Question >> Mar 22, 2024  4:46 PM [place] B wrote: Reason for CRM: pt called in states, Dr Reinhold Carbine was sending in cholesterol medicine for her on May 3 to take additionally oto other cholesterol med she is on. Pt doesn't know the name of it. Pt states the pharmacy didn't have it, when she went to get it. Please cb for further assistance

## 2024-03-25 ENCOUNTER — Other Ambulatory Visit: Payer: Self-pay

## 2024-03-25 DIAGNOSIS — E782 Mixed hyperlipidemia: Secondary | ICD-10-CM

## 2024-03-25 MED ORDER — REPATHA SURECLICK 140 MG/ML ~~LOC~~ SOAJ: 140.0000 mg | SUBCUTANEOUS | 2 refills | Status: DC

## 2024-03-29 ENCOUNTER — Other Ambulatory Visit: Payer: Self-pay

## 2024-03-29 DIAGNOSIS — R932 Abnormal findings on diagnostic imaging of liver and biliary tract: Secondary | ICD-10-CM

## 2024-03-29 DIAGNOSIS — K760 Fatty (change of) liver, not elsewhere classified: Secondary | ICD-10-CM

## 2024-04-06 ENCOUNTER — Ambulatory Visit (HOSPITAL_COMMUNITY)
Admission: RE | Admit: 2024-04-06 | Discharge: 2024-04-06 | Disposition: A | Discharge status: Home / Self Care | Source: Ambulatory Visit | Attending: Gastroenterology | Admitting: Gastroenterology

## 2024-04-06 DIAGNOSIS — D134 Benign neoplasm of liver: Secondary | ICD-10-CM | POA: Diagnosis not present

## 2024-04-06 DIAGNOSIS — K760 Fatty (change of) liver, not elsewhere classified: Secondary | ICD-10-CM | POA: Diagnosis not present

## 2024-04-06 DIAGNOSIS — R160 Hepatomegaly, not elsewhere classified: Secondary | ICD-10-CM | POA: Diagnosis not present

## 2024-04-06 DIAGNOSIS — R932 Abnormal findings on diagnostic imaging of liver and biliary tract: Secondary | ICD-10-CM | POA: Insufficient documentation

## 2024-04-06 MED ORDER — GADOBUTROL 1 MMOL/ML IV SOLN
10.0000 mL | Freq: Once | INTRAVENOUS | Status: AC | PRN
  Administered 2024-04-06: 10 mL via INTRAVENOUS

## 2024-04-14 ENCOUNTER — Ambulatory Visit: Payer: Self-pay | Admitting: Gastroenterology

## 2024-04-14 NOTE — Progress Notes (Signed)
 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 I recommend repeating another MRI in 1 year to continue to monitor the adenoma for stability. The radiologist noted improvement in the fatty change of your liver.  This is great news!  Chelsea Freeman , Please place reminder for repeat MRI with and without contrast in 1 year

## 2024-04-19 ENCOUNTER — Other Ambulatory Visit: Payer: Self-pay | Admitting: Family Medicine

## 2024-04-19 DIAGNOSIS — F341 Dysthymic disorder: Secondary | ICD-10-CM

## 2024-04-24 ENCOUNTER — Ambulatory Visit
Admission: RE | Admit: 2024-04-24 | Discharge: 2024-04-24 | Disposition: A | Discharge status: Home / Self Care | Source: Ambulatory Visit | Attending: Family Medicine | Admitting: Family Medicine

## 2024-04-24 DIAGNOSIS — Z1231 Encounter for screening mammogram for malignant neoplasm of breast: Secondary | ICD-10-CM

## 2024-04-28 ENCOUNTER — Ambulatory Visit: Payer: Self-pay | Admitting: Family Medicine

## 2024-05-01 DIAGNOSIS — G4733 Obstructive sleep apnea (adult) (pediatric): Secondary | ICD-10-CM | POA: Diagnosis not present

## 2024-05-13 DIAGNOSIS — G4733 Obstructive sleep apnea (adult) (pediatric): Secondary | ICD-10-CM | POA: Diagnosis not present

## 2024-06-03 ENCOUNTER — Other Ambulatory Visit: Payer: Self-pay | Admitting: Family Medicine

## 2024-06-03 DIAGNOSIS — E782 Mixed hyperlipidemia: Secondary | ICD-10-CM

## 2024-06-05 ENCOUNTER — Other Ambulatory Visit: Payer: Self-pay | Admitting: Family Medicine

## 2024-06-18 ENCOUNTER — Other Ambulatory Visit: Payer: Self-pay | Admitting: Family Medicine

## 2024-06-27 NOTE — Progress Notes (Signed)
 Subjective:  Patient ID: Chelsea Freeman, female    DOB: Apr 05, 1976  Age: 48 y.o. MRN: [id number]  Chief Complaint  Patient presents with   Medical Management of Chronic Issues    Discussed the use of AI scribe software for clinical note transcription with the patient, who gave verbal consent to proceed.  History of Present Illness   [name] is a 48 year old female with type 2 diabetes and hyperlipidemia who presents for routine follow-up and evaluation of joint pain.  Glycemic control - Type 2 diabetes mellitus - Hemoglobin A1c 6.3%, increased from 6.2% - Currently taking metformin ; out of Farxiga  for four weeks  Dyslipidemia - LDL 27 mg/dL, total cholesterol 873 mg/dL, triglycerides 851 mg/dL - Zetia  10 mg daily and Vascepa  1 gram 2 capsules twice daily, repatha  140 mg once every 2 weeks.   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  Vasomotor symptoms - Hot flashes following hysterectomy with oophorectomy five years ago  Cancer screening and family history - Compliant with regular mammograms - Sister developed breast cancer after hormone therapy but tested negative for genetic markers     Weight loss: Taking phentermine  15 mg 2 daily in am and topamax  50 mg daily. Goes to another office in town for [employer]. Diet: eating healthy and exercising.   Recurrent major Depression:  Pristiq  100 mg daily,    History of ovarian mass and liver mass. Follows with Dr. Stacia. Needs AFP level.        06/28/2024   11:36 AM 02/28/2024    9:29 AM 11/28/2023    2:36 PM 03/31/2023   11:10 AM 08/10/2022    8:34 AM   Depression screen PHQ 2/9  Decreased Interest 0 0 0 0 0  Down, Depressed, Hopeless 0 0 0 0 0  PHQ - 2 Score 0 0 0 0 0  Altered sleeping 0 0 0 0 0  Tired, decreased energy 0 0 0 0 1  Change in appetite 0 0 0 0 0  Feeling bad or failure about yourself  0 0 0 0 0  Trouble concentrating 0 0 0 0 0  Moving slowly or fidgety/restless 0 0 0 0 0  Suicidal thoughts 0 0 0 0 0  PHQ-9 Score 0 0 0 0 1  Difficult doing work/chores Not difficult at all Not difficult at all Not difficult at all Not difficult at all Not difficult at all        11/28/2023    2:36 PM  Fall Risk   Falls in the past year? 0  Number falls in past yr: 0  Injury with Fall? 0  Risk for fall due to : No Fall Risks  Follow up Falls evaluation completed    Patient Care Team: Sherre Clapper, MD as PCP - General (Family Medicine) Berkeley Andrez LABOR, PA-C as Physician Assistant (Hematology and Oncology) Vannie Elsie HERO, OD (Ophthalmology) Stacia Glendia BRAVO, MD as Consulting Physician (Gastroenterology)   Review of Systems  Constitutional:  Positive for diaphoresis (HOt falshes). Negative for chills, fatigue and fever.  HENT:  Negative for congestion, ear pain, rhinorrhea and sore throat.   Respiratory:  Negative for cough and shortness  of breath.   Cardiovascular:  Negative for chest pain.  Gastrointestinal:  Negative for abdominal pain, constipation, diarrhea, nausea and vomiting.  Genitourinary:  Negative for dysuria and urgency.  Musculoskeletal:  Positive for arthralgias. Negative for back pain and myalgias.  Neurological:  Negative for dizziness, weakness, light-headedness and headaches.  Psychiatric/Behavioral:  Negative for dysphoric mood. The patient is not nervous/anxious.     Current Outpatient Medications on File Prior to Visit  Medication Sig Dispense Refill   desvenlafaxine  (PRISTIQ ) 100 MG 24 hr tablet Take 1 tablet (100 mg total) by mouth daily. 90 tablet 2   ezetimibe  (ZETIA ) 10 MG tablet TAKE ONE  TABLET BY MOUTH DAILY 30 tablet 1   icosapent  Ethyl (VASCEPA ) 1 g capsule TAKE 2 CAPSULES BY MOUTH TWICE DAILY 120 capsule 2   ipratropium (ATROVENT) 0.03 % nasal spray Place 2 sprays into both nostrils 3 (three) times daily.     lisinopril  (ZESTRIL ) 10 MG tablet Take 1 tablet (10 mg total) by mouth daily. 90 tablet 1   metFORMIN  (GLUCOPHAGE ) 1000 MG tablet Take 1 tablet (1,000 mg total) by mouth 2 (two) times daily with a meal. 180 tablet 2   REPATHA  SURECLICK 140 MG/ML SOAJ Inject 140 mg into the skin every 14 (fourteen) days. 2 mL 2   Sodium Sulfate-Mag Sulfate-KCl (SUTAB ) 272 [id number] MG TABS Take 24 tablets by mouth as directed. 24 tablet 0   Vitamin D , Ergocalciferol , (DRISDOL ) 1.25 MG (50000 UNIT) CAPS capsule Take 1 capsule (50,000 Units total) by mouth every 7 (seven) days. 12 capsule 3   Current Facility-Administered Medications on File Prior to Visit  Medication Dose Route Frequency Provider Last Rate Last Admin   0.9 %  sodium chloride  infusion  500 mL Intravenous Once Stacia Glendia BRAVO, MD       Past Medical History:  Diagnosis Date   Abnormal uterine bleeding (AUB) 06/28/2016   Anxiety disorder    Cellulitis    Dysmenorrhea 06/28/2016   Dysthymic disorder    Fatigue    Hyperlipidemia    Hyperprolactinemia (HCC)    Hypoxemia    Increased frequency of urination 06/28/2016   Intramural leiomyoma of uterus 06/28/2016   Obesity    Prediabetes    Sebaceous cyst    Sleep apnea    Type 2 diabetes mellitus without complications (HCC)    Urge incontinence 06/28/2016   Past Surgical History:  Procedure Laterality Date   ABDOMINAL HYSTERECTOMY  09/03/2019   Total    Family History  Problem Relation Age of Onset   Rheum arthritis Mother    Hypertension Mother    Depression Mother    Atrial fibrillation Mother    Other Mother        Wegener's Disease   Hypertension Father    Diabetes Father    Heart Problems Father    Parkinson's disease Father    Atrial fibrillation  Sister    Thyroid disease Sister    Cancer Sister        Breast-remission   Heart Problems Sister    Breast cancer Sister    Bipolar disorder Sister    Kidney failure Sister    Cancer Maternal Grandfather        Leukemia   Heart Problems Paternal Grandmother    Heart failure Paternal Grandmother    Stroke Paternal Grandfather    Heart Problems Paternal Grandfather    Colon cancer Neg Hx    Esophageal cancer Neg Hx    Stomach cancer Neg Hx  Colon polyps Neg Hx    Rectal cancer Neg Hx    Social History   Socioeconomic History   Marital status: Married    Spouse name: Marlet Korte   Number of children: 2   Years of education: Not on file   Highest education level: Not on file  Occupational History   Occupation: ink Audiological scientist   Occupation: administrative asst  Tobacco Use   Smoking status: Never   Smokeless tobacco: Never  Vaping Use   Vaping status: Never Used  Substance and Sexual Activity   Alcohol use: Not Currently   Drug use: Not Currently   Sexual activity: Yes    Partners: Male  Other Topics Concern   Not on file  Social History Narrative   ** Merged History Encounter **       Social Drivers of Health   Financial Resource Strain: Low Risk  (02/28/2024)   Overall Financial Resource Strain (CARDIA)    Difficulty of Paying Living Expenses: Not hard at all  Food Insecurity: No Food Insecurity (02/28/2024)   Hunger Vital Sign    Worried About Running Out of Food in the Last Year: Never true    Ran Out of Food in the Last Year: Never true  Transportation Needs: No Transportation Needs (02/28/2024)   PRAPARE - Administrator, Civil Service (Medical): No    Lack of Transportation (Non-Medical): No  Physical Activity: Inactive (02/28/2024)   Exercise Vital Sign    Days of Exercise per Week: 0 days    Minutes of Exercise per Session: 0 min  Stress: No Stress Concern Present (02/28/2024)   [employer] of Occupational Health - Occupational  Stress Questionnaire    Feeling of Stress : Not at all  Social Connections: Moderately Isolated (02/28/2024)   Social Connection and Isolation Panel    Frequency of Communication with Friends and Family: More than three times a week    Frequency of Social Gatherings with Friends and Family: More than three times a week    Attends Religious Services: Never    Database administrator or Organizations: No    Attends Engineer, structural: Never    Marital Status: Married    Objective:  BP 134/72   Pulse 98   Temp 98 F (36.7 C)   Ht 5' 3 (1.6 m)   Wt 205 lb 8 oz (93.2 kg)   LMP  (LMP Unknown)   SpO2 98%   BMI 36.40 kg/m      06/28/2024   11:13 AM 02/28/2024    9:26 AM 11/28/2023    2:35 PM  BP/Weight  Systolic BP 134 136 128  Diastolic BP 72 64 74  Wt. (Lbs) 205.5 200 207  BMI 36.4 kg/m2 35.43 kg/m2 36.67 kg/m2    Physical Exam Vitals reviewed.  Constitutional:      Appearance: Normal appearance. She is normal weight.  Neck:     Vascular: No carotid bruit.  Cardiovascular:     Rate and Rhythm: Normal rate and regular rhythm.     Pulses: Normal pulses.     Heart sounds: Normal heart sounds.  Pulmonary:     Effort: Pulmonary effort is normal. No respiratory distress.     Breath sounds: Normal breath sounds.  Abdominal:     General: Abdomen is flat. Bowel sounds are normal.     Palpations: Abdomen is soft.     Tenderness: There is no abdominal tenderness.  Musculoskeletal:  General: No swelling, tenderness or deformity. Normal range of motion.  Neurological:     Mental Status: She is alert and oriented to person, place, and time.  Psychiatric:        Mood and Affect: Mood normal.        Behavior: Behavior normal.      Diabetic foot exam was performed with the following findings:   No deformities, ulcerations, or other skin breakdown Normal sensation of 10g monofilament Intact posterior tibialis and dorsalis pedis pulses      Lab Results   Component Value Date   WBC 8.7 06/28/2024   HGB 14.6 06/28/2024   HCT 43.9 06/28/2024   PLT 283 06/28/2024   GLUCOSE 86 06/28/2024   CHOL 235 (H) 02/28/2024   TRIG 139 02/28/2024   HDL 56 02/28/2024   LDLCALC 154 (H) 02/28/2024   ALT 27 06/28/2024   AST 11 06/28/2024   NA 141 06/28/2024   K 4.5 06/28/2024   CL 103 06/28/2024   CREATININE 0.72 06/28/2024   BUN 9 06/28/2024   CO2 21 06/28/2024   TSH 0.856 02/10/2022   HGBA1C 6.3 (A) 06/28/2024            Component Ref Range & Units (hover) 10 d ago (06/28/24) 4 mo ago (02/28/24) 7 mo ago (11/28/23) 1 yr ago (04/07/23) 1 yr ago (12/23/22) 1 yr ago (09/07/22) 2 yr ago (02/10/22)  TC 126        HDL 69 56 R 60 R 58 R 45 R 46 R 55 R  TRG 148        LDL 27        Non-HDL 57           Assessment & Plan:  Diabetes mellitus without complication (HCC) Assessment & Plan: Complicated by obesity and hyperlipidemia. Continue work on [employer] and exercise. Check feet daily. Does not need to check sugars daily. Continue metformin  1000 mg twice daily and restart farxiga  10 mg daily.   On lisinopril  10 mg daily for renal protection. Check labs  Orders: -     POCT glycosylated hemoglobin (Hb A1C) -     Dapagliflozin  Propanediol; Take 1 tablet (10 mg total) by mouth every morning.  Dispense: 90 tablet; Refill: 1  Mixed hyperlipidemia Assessment & Plan: At goal Continue zetia  10 mg daily, repatha  and vascepa  1 gm 2 capsules twice daily.  Continue to work on eating a healthy diet and exercise.    Orders: -     POCT Lipid Panel -     CBC with Differential/Platelet -     Comprehensive metabolic panel with GFR  Depression, major, recurrent, mild (HCC) Assessment & Plan: Well controlled.  No changes to medicines. Continue Pristiq  100 mg daily. Continue to work on eating a healthy diet and exercise.      Arthralgia of both hands Assessment & Plan: Check labs.  Orders: -     Rheumatoid factor -     CYCLIC CITRUL  PEPTIDE ANTIBODY, IGG/IGA -     Sedimentation rate -     C-reactive protein -     ANA w/Reflex  Menopausal hot flushes Assessment & Plan: Check hormones levels Experiencing hot flashes five years post-surgery. Prefers to avoid hormone therapy due to family history of breast cancer. - Consider non-hormonal management options for menopausal symptoms.  Orders: -     FSH/LH  Class 2 severe obesity due to excess calories with serious comorbidity and body mass index (BMI)  of 36.0 to 36.9 in adult [facility]) Assessment & Plan: -Change Phentermine  to 37.5 mg daily and continue Topamax  25 mg every day  Patient continue to work on diet and exercise.  We will take over care of her weight management.    Orders: -     Phentermine  HCl; Take 1 tablet (37.5 mg total) by mouth daily before breakfast.  Dispense: 90 tablet; Refill: 0 -     Topiramate ; Take 1 tablet (25 mg total) by mouth daily.  Dispense: 90 tablet; Refill: 0  Leg length discrepancy Assessment & Plan: Chronic pain due to leg length discrepancy. No recent imaging or interventions. - Recommend using two Dr. Heriberto inserts in the left shoe.   Right hip pain Assessment & Plan: Chronic pain due to leg length discrepancy. No recent imaging or interventions. - Recommend using two Dr. Heriberto inserts in the left shoe.     Meds ordered this encounter  Medications   dapagliflozin  propanediol (FARXIGA ) 10 MG TABS tablet    Sig: Take 1 tablet (10 mg total) by mouth every morning.    Dispense:  90 tablet    Refill:  1    This prescription was filled on 09/25/2023. Any refills authorized will be placed on file.   phentermine  (ADIPEX-P ) 37.5 MG tablet    Sig: Take 1 tablet (37.5 mg total) by mouth daily before breakfast.    Dispense:  90 tablet    Refill:  0   topiramate  (TOPAMAX ) 25 MG tablet    Sig: Take 1 tablet (25 mg total) by mouth daily.    Dispense:  90 tablet    Refill:  0    Orders Placed This Encounter  Procedures    CBC with Differential/Platelet   Comprehensive metabolic panel with GFR   Rheumatoid factor   CYCLIC CITRUL PEPTIDE ANTIBODY, IGG/IGA   Sedimentation rate   C-reactive protein   ANA w/Reflex   FSH/LH   POCT Lipid Panel   POCT glycosylated hemoglobin (Hb A1C)    Total time spent on today's visit was 45 minutes, including both face-to-face time and nonface-to-face time personally spent on review of chart (labs and imaging), discussing labs and goals, discussing further work-up, treatment options, referrals to specialist if needed, reviewing outside records of pertinent, answering patient's questions, and coordinating care.  Follow-up: Return in about 3 months (around 09/28/2024) for chronic follow up.  I,Marla I Leal-Borjas,acting as a scribe for Abigail Free, MD.,have documented all relevant documentation on the behalf of Abigail Free, MD,as directed by  Abigail Free, MD while in the presence of Abigail Free, MD.    An After Visit Summary was printed and given to the patient.  I attest that I have reviewed this visit and agree with the plan scribed by my staff.   Abigail Free, MD [facility] [phone]

## 2024-06-28 ENCOUNTER — Ambulatory Visit: Admitting: Family Medicine

## 2024-06-28 ENCOUNTER — Encounter: Payer: Self-pay | Admitting: Family Medicine

## 2024-06-28 VITALS — BP 134/72 | HR 98 | Temp 98.0°F | Ht 63.0 in | Wt 205.5 lb

## 2024-06-28 DIAGNOSIS — F330 Major depressive disorder, recurrent, mild: Secondary | ICD-10-CM

## 2024-06-28 DIAGNOSIS — M25541 Pain in joints of right hand: Secondary | ICD-10-CM | POA: Diagnosis not present

## 2024-06-28 DIAGNOSIS — M2170 Unequal limb length (acquired), unspecified site: Secondary | ICD-10-CM

## 2024-06-28 DIAGNOSIS — M25551 Pain in right hip: Secondary | ICD-10-CM

## 2024-06-28 DIAGNOSIS — E66812 Obesity, class 2: Secondary | ICD-10-CM

## 2024-06-28 DIAGNOSIS — E119 Type 2 diabetes mellitus without complications: Secondary | ICD-10-CM | POA: Diagnosis not present

## 2024-06-28 DIAGNOSIS — N951 Menopausal and female climacteric states: Secondary | ICD-10-CM | POA: Diagnosis not present

## 2024-06-28 DIAGNOSIS — M25542 Pain in joints of left hand: Secondary | ICD-10-CM | POA: Diagnosis not present

## 2024-06-28 DIAGNOSIS — E782 Mixed hyperlipidemia: Secondary | ICD-10-CM

## 2024-06-28 DIAGNOSIS — Z7984 Long term (current) use of oral hypoglycemic drugs: Secondary | ICD-10-CM

## 2024-06-28 DIAGNOSIS — Z6836 Body mass index (BMI) 36.0-36.9, adult: Secondary | ICD-10-CM

## 2024-06-28 LAB — POCT LIPID PANEL
HDL: 69
LDL: 27
Non-HDL: 57
TC: 126
TRG: 148

## 2024-06-28 LAB — POCT GLYCOSYLATED HEMOGLOBIN (HGB A1C): Hemoglobin A1C: 6.3 % — AB (ref 4.0–5.6)

## 2024-06-28 MED ORDER — DAPAGLIFLOZIN PROPANEDIOL 10 MG PO TABS: 10.0000 mg | ORAL_TABLET | Freq: Every morning | ORAL | 1 refills | Status: AC

## 2024-06-28 MED ORDER — TOPIRAMATE 25 MG PO TABS: 25.0000 mg | ORAL_TABLET | Freq: Every day | ORAL | 0 refills | Status: DC

## 2024-06-28 MED ORDER — PHENTERMINE HCL 37.5 MG PO TABS: 37.5000 mg | ORAL_TABLET | Freq: Every day | ORAL | 0 refills | Status: DC

## 2024-06-30 ENCOUNTER — Ambulatory Visit: Payer: Self-pay | Admitting: Family Medicine

## 2024-06-30 DIAGNOSIS — N951 Menopausal and female climacteric states: Secondary | ICD-10-CM | POA: Insufficient documentation

## 2024-06-30 DIAGNOSIS — M25541 Pain in joints of right hand: Secondary | ICD-10-CM | POA: Insufficient documentation

## 2024-06-30 DIAGNOSIS — E66812 Obesity, class 2: Secondary | ICD-10-CM | POA: Insufficient documentation

## 2024-06-30 NOTE — Assessment & Plan Note (Addendum)
 At goal Continue zetia  10 mg daily, repatha  and vascepa  1 gm 2 capsules twice daily.  Continue to work on eating a healthy diet and exercise.

## 2024-06-30 NOTE — Assessment & Plan Note (Addendum)
 Check hormones levels Experiencing hot flashes five years post-surgery. Prefers to avoid hormone therapy due to family history of breast cancer. - Consider non-hormonal management options for menopausal symptoms.

## 2024-06-30 NOTE — Assessment & Plan Note (Signed)
 Well controlled.  No changes to medicines. Continue Pristiq  100 mg daily. Continue to work on eating a healthy diet and exercise.

## 2024-06-30 NOTE — Assessment & Plan Note (Signed)
 Check labs

## 2024-06-30 NOTE — Assessment & Plan Note (Addendum)
 Complicated by obesity and hyperlipidemia. Continue work on [employer] and exercise. Check feet daily. Does not need to check sugars daily. Continue metformin  1000 mg twice daily and restart farxiga  10 mg daily.   On lisinopril  10 mg daily for renal protection. Check labs

## 2024-06-30 NOTE — Assessment & Plan Note (Addendum)
-  Change Phentermine  to 37.5 mg daily and continue Topamax  25 mg every day  Patient continue to work on diet and exercise.  We will take over care of her weight management.

## 2024-07-03 ENCOUNTER — Encounter: Payer: Self-pay | Admitting: Family Medicine

## 2024-07-03 LAB — COMPREHENSIVE METABOLIC PANEL WITH GFR
ALT: 27 IU/L (ref 0–32)
AST: 11 IU/L (ref 0–40)
Albumin: 4.8 g/dL (ref 3.9–4.9)
Alkaline Phosphatase: 80 IU/L (ref 44–121)
BUN/Creatinine Ratio: 13 (ref 9–23)
BUN: 9 mg/dL (ref 6–24)
Bilirubin Total: 0.3 mg/dL (ref 0.0–1.2)
CO2: 21 mmol/L (ref 20–29)
Calcium: 10.1 mg/dL (ref 8.7–10.2)
Chloride: 103 mmol/L (ref 96–106)
Creatinine, Ser: 0.72 mg/dL (ref 0.57–1.00)
Globulin, Total: 3.1 g/dL (ref 1.5–4.5)
Glucose: 86 mg/dL (ref 70–99)
Potassium: 4.5 mmol/L (ref 3.5–5.2)
Sodium: 141 mmol/L (ref 134–144)
Total Protein: 7.9 g/dL (ref 6.0–8.5)
eGFR: 104 mL/min/1.73 (ref >59)

## 2024-07-03 LAB — CYCLIC CITRUL PEPTIDE ANTIBODY, IGG/IGA: Cyclic Citrullin Peptide Ab: 4 U (ref 0–19)

## 2024-07-03 LAB — CBC WITH DIFFERENTIAL/PLATELET
Basophils Absolute: 0.1 x10E3/uL (ref 0.0–0.2)
Basos: 1 %
EOS (ABSOLUTE): 0.1 x10E3/uL (ref 0.0–0.4)
Eos: 1 %
Hematocrit: 43.9 % (ref 34.0–46.6)
Hemoglobin: 14.6 g/dL (ref 11.1–15.9)
Immature Grans (Abs): 0 x10E3/uL (ref 0.0–0.1)
Immature Granulocytes: 0 %
Lymphocytes Absolute: 2.4 x10E3/uL (ref 0.7–3.1)
Lymphs: 27 %
MCH: 29.9 pg (ref 26.6–33.0)
MCHC: 33.3 g/dL (ref 31.5–35.7)
MCV: 90 fL (ref 79–97)
Monocytes Absolute: 0.8 x10E3/uL (ref 0.1–0.9)
Monocytes: 9 %
Neutrophils Absolute: 5.4 x10E3/uL (ref 1.4–7.0)
Neutrophils: 62 %
Platelets: 283 x10E3/uL (ref 150–450)
RBC: 4.88 x10E6/uL (ref 3.77–5.28)
RDW: 12.6 % (ref 11.7–15.4)
WBC: 8.7 x10E3/uL (ref 3.4–10.8)

## 2024-07-03 LAB — RHEUMATOID FACTOR: Rheumatoid fact SerPl-aCnc: 10.3 [IU]/mL (ref <14.0)

## 2024-07-03 LAB — FSH/LH
FSH: 9.8 m[IU]/mL
LH: 7.9 m[IU]/mL

## 2024-07-03 LAB — SEDIMENTATION RATE: Sed Rate: 6 mm/h (ref 0–32)

## 2024-07-03 LAB — C-REACTIVE PROTEIN: CRP: 8 mg/L (ref 0–10)

## 2024-07-03 LAB — ANA W/REFLEX: Anti Nuclear Antibody (ANA): NEGATIVE

## 2024-07-08 DIAGNOSIS — M2170 Unequal limb length (acquired), unspecified site: Secondary | ICD-10-CM | POA: Insufficient documentation

## 2024-07-08 DIAGNOSIS — M25551 Pain in right hip: Secondary | ICD-10-CM | POA: Insufficient documentation

## 2024-07-08 NOTE — Assessment & Plan Note (Signed)
 Chronic pain due to leg length discrepancy. No recent imaging or interventions. - Recommend using two Dr. Heriberto inserts in the left shoe.

## 2024-07-08 NOTE — Patient Instructions (Signed)
  VISIT SUMMARY: Today, you had a follow-up visit to manage your type 2 diabetes, hyperlipidemia, and joint pain. We also discussed your menopausal symptoms and medication effects.  YOUR PLAN: TYPE 2 DIABETES MELLITUS: Your A1c level has slightly increased from 6.2 to 6.3. -Continue taking Farxiga  and metformin . -A prescription for Farxiga  has been sent to your pharmacy. -Consider seeing a nutritionist for dietary management. -Keep up with regular exercise and healthy eating habits.  OBESITY: You are managing your weight with phentermine  and Topamax , but experiencing hair loss with Topamax . -Continue taking phentermine  37.5 mg daily until your current supply is exhausted. -Continue taking Topamax  25 mg daily. -Review the [employer] handout for lifestyle modifications. -Increase your intake of fruits and vegetables and maintain regular exercise.  HYPERLIPIDEMIA: Your cholesterol levels are well-controlled with your current medications. -Continue taking Repatha , Zetia , and Vascepa . -We will check your liver and kidney function and blood count today.  RIGHT HIP PAIN DUE TO LEG LENGTH DISCREPANCY: You have chronic pain due to a leg length discrepancy. -Use two Dr. Heriberto inserts in your left shoe.  ARTHRALGIA OF RIGHT ELBOW, KNEES, AND HANDS: You have joint pain and a family history of rheumatoid arthritis and Wegener's disease. -We will order an arthritis panel to evaluate for potential underlying causes.  MENOPAUSAL SYMPTOMS AFTER HYSTERECTOMY WITH BILATERAL OOPHORECTOMY: You are experiencing hot flashes and prefer to avoid hormone therapy. -We will consider non-hormonal management options for your menopausal symptoms.                      Contains text generated by [employer].                                 Contains text generated by [employer].

## 2024-07-30 DIAGNOSIS — G4733 Obstructive sleep apnea (adult) (pediatric): Secondary | ICD-10-CM | POA: Diagnosis not present

## 2024-08-04 ENCOUNTER — Other Ambulatory Visit: Payer: Self-pay | Admitting: Family Medicine

## 2024-08-04 DIAGNOSIS — I152 Hypertension secondary to endocrine disorders: Secondary | ICD-10-CM

## 2024-08-14 ENCOUNTER — Ambulatory Visit: Admitting: Family Medicine

## 2024-08-27 DIAGNOSIS — E119 Type 2 diabetes mellitus without complications: Secondary | ICD-10-CM | POA: Diagnosis not present

## 2024-08-27 LAB — OPHTHALMOLOGY REPORT-SCANNED

## 2024-09-02 ENCOUNTER — Other Ambulatory Visit: Payer: Self-pay

## 2024-09-02 DIAGNOSIS — E782 Mixed hyperlipidemia: Secondary | ICD-10-CM

## 2024-09-02 MED ORDER — REPATHA SURECLICK 140 MG/ML ~~LOC~~ SOAJ: 140.0000 mg | SUBCUTANEOUS | 2 refills | Status: DC

## 2024-09-03 ENCOUNTER — Other Ambulatory Visit: Payer: Self-pay | Admitting: Family Medicine

## 2024-09-09 ENCOUNTER — Other Ambulatory Visit: Payer: Self-pay | Admitting: Medical Genetics

## 2024-09-09 DIAGNOSIS — Z006 Encounter for examination for normal comparison and control in clinical research program: Secondary | ICD-10-CM

## 2024-09-29 ENCOUNTER — Other Ambulatory Visit: Payer: Self-pay | Admitting: Family Medicine

## 2024-09-29 DIAGNOSIS — Z6836 Body mass index (BMI) 36.0-36.9, adult: Secondary | ICD-10-CM

## 2024-09-30 ENCOUNTER — Ambulatory Visit: Admitting: Family Medicine

## 2024-09-30 ENCOUNTER — Encounter: Payer: Self-pay | Admitting: Family Medicine

## 2024-09-30 VITALS — BP 132/80 | HR 95 | Temp 97.8°F | Ht 63.0 in | Wt 207.0 lb

## 2024-09-30 DIAGNOSIS — F330 Major depressive disorder, recurrent, mild: Secondary | ICD-10-CM | POA: Diagnosis not present

## 2024-09-30 DIAGNOSIS — Z6836 Body mass index (BMI) 36.0-36.9, adult: Secondary | ICD-10-CM

## 2024-09-30 DIAGNOSIS — I152 Hypertension secondary to endocrine disorders: Secondary | ICD-10-CM

## 2024-09-30 DIAGNOSIS — E66812 Obesity, class 2: Secondary | ICD-10-CM

## 2024-09-30 DIAGNOSIS — Z23 Encounter for immunization: Secondary | ICD-10-CM | POA: Diagnosis not present

## 2024-09-30 DIAGNOSIS — E782 Mixed hyperlipidemia: Secondary | ICD-10-CM

## 2024-09-30 DIAGNOSIS — E1159 Type 2 diabetes mellitus with other circulatory complications: Secondary | ICD-10-CM

## 2024-09-30 LAB — POCT LIPID PANEL
HDL: 59
LDL: 55
Non-HDL: 80
TC/HDL: 0.9
TC: 139
TRG: 122

## 2024-09-30 LAB — POCT GLYCOSYLATED HEMOGLOBIN (HGB A1C): HbA1c POC (<> result, manual entry): 6.2 % (ref 4.0–5.6)

## 2024-09-30 MED ORDER — TOPIRAMATE 50 MG PO TABS
50.0000 mg | ORAL_TABLET | Freq: Every day | ORAL | 1 refills | Status: AC
Start: 2024-09-30 — End: ?

## 2024-09-30 NOTE — Assessment & Plan Note (Addendum)
 Management includes phentermine  and Topamax . Resumed Topamax  with improved cravings. - Continue phentermine  and Topamax  for weight management.

## 2024-09-30 NOTE — Assessment & Plan Note (Addendum)
 Well-managed with total cholesterol 139, HDL 59, LDL 55. - Continue Repatha  and Vascepa  for cholesterol management. Orders:   POCT Lipid Panel

## 2024-09-30 NOTE — Progress Notes (Signed)
 Subjective:  Patient ID: Chelsea Freeman, female    DOB: [birth year]  Age: 48 y.o. MRN: [id number]  Chief Complaint  Patient presents with   Medical Management of Chronic Issues    HPI: Discussed the use of AI scribe software for clinical note transcription with the patient, who gave verbal consent to proceed.  History of Present Illness Chelsea Freeman is a 48 year old female who presents for medication management.  Appetite and food cravings - Cravings for sweets, self-identified as a 'chocoholic' with preference for sweets over regular food - Cravings worsened after discontinuation of Topamax , improved after restarting Topamax  last week  Weight management pharmacotherapy - Currently taking phentermine  and Topamax  for appetite and weight management - Previously discontinued Topamax  due to hair loss, which led to return of cravings - Restarted Topamax  last week with improvement in cravings  Glycemic control - Currently taking metformin  1000 mg twice daily and Farxiga  10 mg daily - Does not regularly check blood glucose at home due to stable readings - Most recent hemoglobin A1c is 6.2  Lipid management - Currently taking Repatha  and Vascepa  - Cholesterol levels well-controlled: total cholesterol 139, HDL 59, LDL 55  Chronic joint pain - Persistent joint pain in hands - Manages pain with Aleve or ibuprofen as needed - Prescribed meloxicam but did not receive it, continues to use OTC NSAIDs - No associated fevers, chills, sweats, earaches, sore throat, stuffy nose, chest pain, breathing problems, bowel problems, or bladder issues - Family history of rheumatoid arthritis (mother) - Recent blood work for arthritis was normal       09/30/2024    9:37 AM 06/28/2024   11:36 AM 02/28/2024    9:29 AM 11/28/2023    2:36 PM 03/31/2023   11:10 AM  Depression screen PHQ 2/9  Decreased Interest 0 0 0 0 0  Down, Depressed, Hopeless 0 0 0 0 0  PHQ - 2 Score 0 0 0 0 0  Altered  sleeping 0 0 0 0 0  Tired, decreased energy 0 0 0 0 0  Change in appetite 0 0 0 0 0  Feeling bad or failure about yourself  0 0 0 0 0  Trouble concentrating 0 0 0 0 0  Moving slowly or fidgety/restless 0 0 0 0 0  Suicidal thoughts 0 0 0 0 0  PHQ-9 Score 0 0  0  0  0   Difficult doing work/chores Not difficult at all Not difficult at all Not difficult at all Not difficult at all Not difficult at all     Data saved with a previous flowsheet row definition        09/30/2024    9:36 AM  Fall Risk   Falls in the past year? 0  Number falls in past yr: 0  Injury with Fall? 0  Risk for fall due to : No Fall Risks  Follow up Falls evaluation completed    Patient Care Team: Sherre Clapper, MD as PCP - General (Family Medicine) [place], Andrez LABOR, PA-C as Physician Assistant (Hematology and Oncology) Vannie Elsie HERO, OD (Ophthalmology) Stacia Glendia BRAVO, MD as Consulting Physician (Gastroenterology)    Current Outpatient Medications on File Prior to Visit  Medication Sig Dispense Refill   dapagliflozin  propanediol (FARXIGA ) 10 MG TABS tablet Take 1 tablet (10 mg total) by mouth every morning. 90 tablet 1   desvenlafaxine  (PRISTIQ ) 100 MG 24 hr tablet Take 1 tablet (100 mg total) by mouth daily. 90 tablet 2  Evolocumab  (REPATHA  SURECLICK) 140 MG/ML SOAJ Inject 140 mg into the skin every 14 (fourteen) days. 2 mL 2   icosapent  Ethyl (VASCEPA ) 1 g capsule TAKE 2 CAPSULES BY MOUTH TWICE DAILY 120 capsule 2   lisinopril  (ZESTRIL ) 10 MG tablet Take 1 tablet (10 mg total) by mouth daily. 90 tablet 1   metFORMIN  (GLUCOPHAGE ) 1000 MG tablet Take 1 tablet (1,000 mg total) by mouth 2 (two) times daily with a meal. 180 tablet 2   phentermine  (ADIPEX-P ) 37.5 MG tablet Take 1 tablet (37.5 mg total) by mouth daily before breakfast. 90 tablet 0   Vitamin D , Ergocalciferol , (DRISDOL ) 1.25 MG (50000 UNIT) CAPS capsule Take 1 capsule (50,000 Units total) by mouth every 7 (seven) days. 12 capsule 3    Current Facility-Administered Medications on File Prior to Visit  Medication Dose Route Frequency Provider Last Rate Last Admin   0.9 %  sodium chloride  infusion  500 mL Intravenous Once Stacia Glendia BRAVO, MD       Past Medical History:  Diagnosis Date   Abnormal uterine bleeding (AUB) 06/28/2016   Anxiety disorder    Cellulitis    Dysmenorrhea 06/28/2016   Dysthymic disorder    Fatigue    Hyperlipidemia    Hyperprolactinemia    Hypoxemia    Increased frequency of urination 06/28/2016   Intramural leiomyoma of uterus 06/28/2016   Obesity    Prediabetes    Sebaceous cyst    Sleep apnea    Type 2 diabetes mellitus without complications (HCC)    Urge incontinence 06/28/2016   Past Surgical History:  Procedure Laterality Date   ABDOMINAL HYSTERECTOMY  09/03/2019   Total    Family History  Problem Relation Age of Onset   Rheum arthritis Mother    Hypertension Mother    Depression Mother    Atrial fibrillation Mother    Other Mother        Wegener's Disease   Hypertension Father    Diabetes Father    Heart Problems Father    Parkinson's disease Father    Atrial fibrillation Sister    Thyroid disease Sister    Cancer Sister        Breast-remission   Heart Problems Sister    Breast cancer Sister    Bipolar disorder Sister    Kidney failure Sister    Cancer Maternal Grandfather        Leukemia   Heart Problems Paternal Grandmother    Heart failure Paternal Grandmother    Stroke Paternal Grandfather    Heart Problems Paternal Grandfather    Colon cancer Neg Hx    Esophageal cancer Neg Hx    Stomach cancer Neg Hx    Colon polyps Neg Hx    Rectal cancer Neg Hx    Social History   Socioeconomic History   Marital status: Married    Spouse name: Karthika Glasper   Number of children: 2   Years of education: Not on file   Highest education level: Not on file  Occupational History   Occupation: ink Audiological Scientist   Occupation: administrative asst  Tobacco Use    Smoking status: Never   Smokeless tobacco: Never  Vaping Use   Vaping status: Never Used  Substance and Sexual Activity   Alcohol use: Not Currently   Drug use: Not Currently   Sexual activity: Yes    Partners: Male  Other Topics Concern   Not on file  Social History Narrative   ** Merged History  Encounter **       Social Drivers of Health   Financial Resource Strain: Low Risk  (02/28/2024)   Overall Financial Resource Strain (CARDIA)    Difficulty of Paying Living Expenses: Not hard at all  Food Insecurity: No Food Insecurity (02/28/2024)   Hunger Vital Sign    Worried About Running Out of Food in the Last Year: Never true    Ran Out of Food in the Last Year: Never true  Transportation Needs: No Transportation Needs (02/28/2024)   PRAPARE - Administrator, Civil Service (Medical): No    Lack of Transportation (Non-Medical): No  Physical Activity: Inactive (02/28/2024)   Exercise Vital Sign    Days of Exercise per Week: 0 days    Minutes of Exercise per Session: 0 min  Stress: No Stress Concern Present (02/28/2024)   [employer] of Occupational Health - Occupational Stress Questionnaire    Feeling of Stress : Not at all  Social Connections: Moderately Isolated (02/28/2024)   Social Connection and Isolation Panel    Frequency of Communication with Friends and Family: More than three times a week    Frequency of Social Gatherings with Friends and Family: More than three times a week    Attends Religious Services: Never    Database Administrator or Organizations: No    Attends Engineer, Structural: Never    Marital Status: Married    Objective:  BP 132/80   Pulse 95   Temp 97.8 F (36.6 C) (Temporal)   Ht 5' 3 (1.6 m)   Wt 207 lb (93.9 kg)   LMP  (LMP Unknown)   SpO2 98%   BMI 36.67 kg/m      09/30/2024   10:54 PM 09/30/2024    9:37 AM 06/28/2024   11:13 AM  BP/Weight  Systolic BP 132 142 134  Diastolic BP 80 84 72  Wt. (Lbs)  207  205.5  BMI  36.67 kg/m2 36.4 kg/m2    Physical Exam Vitals reviewed.  Constitutional:      Appearance: Normal appearance.  Neck:     Vascular: No carotid bruit.  Cardiovascular:     Rate and Rhythm: Normal rate and regular rhythm.     Pulses: Normal pulses.     Heart sounds: Normal heart sounds.  Pulmonary:     Effort: Pulmonary effort is normal. No respiratory distress.     Breath sounds: Normal breath sounds.  Abdominal:     General: Abdomen is flat. Bowel sounds are normal.     Palpations: Abdomen is soft.     Tenderness: There is no abdominal tenderness.  Neurological:     Mental Status: She is alert and oriented to person, place, and time.  Psychiatric:        Mood and Affect: Mood normal.        Behavior: Behavior normal.      Diabetic foot exam was performed with the following findings:   No deformities, ulcerations, or other skin breakdown Normal sensation of 10g monofilament Intact posterior tibialis and dorsalis pedis pulses      Lab Results  Component Value Date   WBC 8.7 06/28/2024   HGB 14.6 06/28/2024   HCT 43.9 06/28/2024   PLT 283 06/28/2024   GLUCOSE 86 06/28/2024   CHOL 235 (H) 02/28/2024   TRIG 139 02/28/2024   HDL 56 02/28/2024   LDLCALC 154 (H) 02/28/2024   ALT 27 06/28/2024   AST 11 06/28/2024  NA 141 06/28/2024   K 4.5 06/28/2024   CL 103 06/28/2024   CREATININE 0.72 06/28/2024   BUN 9 06/28/2024   CO2 21 06/28/2024   TSH 0.856 02/10/2022   HGBA1C 6.2 09/30/2024    Results for orders placed or performed in visit on 09/30/24  POCT Lipid Panel   Collection Time: 09/30/24  9:50 AM  Result Value Ref Range   TC 139    HDL 59    TRG 122    LDL 55    Non-HDL 80    TC/HDL 0.9   POCT glycosylated hemoglobin (Hb A1C)   Collection Time: 09/30/24  9:50 AM  Result Value Ref Range   Hemoglobin A1C     HbA1c POC (<> result, manual entry) 6.2 4.0 - 5.6 %   HbA1c, POC (prediabetic range)     HbA1c, POC (controlled diabetic range)     .  Assessment & Plan:   Assessment & Plan Hypertension associated with diabetes (HCC) Blood pressure slightly elevated. On lisinopril  for blood pressure and kidney protection. - Rechecked blood pressure. - Continue lisinopril  for blood pressure management.  Type 2 diabetes mellitus Well-controlled with A1c of 6.2. Current medications include metformin  and Farxiga . - Continue metformin  1000 mg twice daily. - Continue Farxiga  10 mg daily. Orders:   POCT glycosylated hemoglobin (Hb A1C)  Mixed hyperlipidemia Well-managed with total cholesterol 139, HDL 59, LDL 55. - Continue Repatha  and Vascepa  for cholesterol management. Orders:   POCT Lipid Panel  Depression, major, recurrent, mild Managed with Pristiq . Reports feeling happy and satisfied. - Continue Pristiq  for depression management.    Class 2 severe obesity due to excess calories with serious comorbidity and body mass index (BMI) of 36.0 to 36.9 in adult Management includes phentermine  and Topamax . Resumed Topamax  with improved cravings. - Continue phentermine  and Topamax  for weight management.    Need for vaccination  Orders:   Tdap vaccine greater than or equal to 7yo IM   Body mass index is 36.67 kg/m.   Meds ordered this encounter  Medications   topiramate  (TOPAMAX ) 50 MG tablet    Sig: Take 1 tablet (50 mg total) by mouth daily.    Dispense:  90 tablet    Refill:  1    Orders Placed This Encounter  Procedures   Tdap vaccine greater than or equal to 7yo IM   POCT Lipid Panel   POCT glycosylated hemoglobin (Hb A1C)     I,Marla I Leal-Borjas,acting as a scribe for Abigail Free, MD.,have documented all relevant documentation on the behalf of Abigail Free, MD,as directed by  Abigail Free, MD while in the presence of Abigail Free, MD.   Follow-up: Return in about 3 months (around 12/31/2024) for cpe fasting.  An After Visit Summary was printed and given to the patient.  I attest that I have reviewed this  visit and agree with the plan scribed by my staff.   Abigail Free, MD [facility] [phone]

## 2024-10-01 DIAGNOSIS — I152 Hypertension secondary to endocrine disorders: Secondary | ICD-10-CM | POA: Insufficient documentation

## 2024-10-01 NOTE — Assessment & Plan Note (Addendum)
 Blood pressure slightly elevated. On lisinopril  for blood pressure and kidney protection. - Rechecked blood pressure. - Continue lisinopril  for blood pressure management.  Type 2 diabetes mellitus Well-controlled with A1c of 6.2. Current medications include metformin  and Farxiga . - Continue metformin  1000 mg twice daily. - Continue Farxiga  10 mg daily. Orders:   POCT glycosylated hemoglobin (Hb A1C)

## 2024-10-01 NOTE — Assessment & Plan Note (Addendum)
 Managed with Pristiq . Reports feeling happy and satisfied. - Continue Pristiq  for depression management.

## 2024-10-03 ENCOUNTER — Other Ambulatory Visit: Payer: Self-pay | Admitting: Family Medicine

## 2024-10-05 NOTE — Patient Instructions (Signed)
  VISIT SUMMARY: Today, we discussed your medication management for various conditions including weight management, diabetes, cholesterol, hypertension, and depression. We also administered a tetanus vaccine.  YOUR PLAN: OBESITY: You are managing your weight with phentermine  and Topamax . You recently restarted Topamax , which has helped reduce your cravings for sweets. -Continue taking phentermine  and Topamax  as prescribed.  TYPE 2 DIABETES MELLITUS: Your diabetes is well-controlled with a recent A1c of 6.2. You are currently taking metformin  and Farxiga . -Continue taking metformin  1000 mg twice daily. -Continue taking Farxiga  10 mg daily.  MIXED HYPERLIPIDEMIA: Your cholesterol levels are well-managed with your current medications. -Continue taking Repatha  and Vascepa  as prescribed.  HYPERTENSION: Your blood pressure is slightly elevated. You are taking lisinopril  for blood pressure and kidney protection. -Continue taking lisinopril  as prescribed.  DEPRESSION: Your depression is managed with Pristiq , and you report feeling happy and satisfied. -Continue taking Pristiq  as prescribed.  GENERAL HEALTH MAINTENANCE: You were due for a tetanus vaccine. -You received a tetanus vaccine today. Be aware of potential side effects such as soreness at the injection site, mild fever, or fatigue.                      Contains text generated by [employer].                                 Contains text generated by [employer]bridge.

## 2024-10-28 DIAGNOSIS — G4733 Obstructive sleep apnea (adult) (pediatric): Secondary | ICD-10-CM | POA: Diagnosis not present

## 2024-12-01 ENCOUNTER — Other Ambulatory Visit: Payer: Self-pay | Admitting: Family Medicine

## 2024-12-01 DIAGNOSIS — E782 Mixed hyperlipidemia: Secondary | ICD-10-CM

## 2024-12-03 ENCOUNTER — Other Ambulatory Visit: Payer: Self-pay | Admitting: Family Medicine
